# Patient Record
Sex: Female | Born: 1985 | Race: White | Hispanic: No | Marital: Married | Smoking: Never smoker
Health system: Southern US, Community
[De-identification: ages and names within clinical notes are randomized; demographics above are authoritative.]

## PROBLEM LIST (undated history)

## (undated) DIAGNOSIS — E282 Polycystic ovarian syndrome: Secondary | ICD-10-CM

## (undated) DIAGNOSIS — N946 Dysmenorrhea, unspecified: Secondary | ICD-10-CM

## (undated) DIAGNOSIS — K219 Gastro-esophageal reflux disease without esophagitis: Secondary | ICD-10-CM

## (undated) HISTORY — DX: Dysmenorrhea, unspecified: N94.6

## (undated) HISTORY — DX: Polycystic ovarian syndrome: E28.2

## (undated) HISTORY — PX: FRACTURE SURGERY: SHX138

## (undated) HISTORY — DX: Gastro-esophageal reflux disease without esophagitis: K21.9

---
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
## 2006-09-04 ENCOUNTER — Emergency Department: Payer: Self-pay | Admitting: Emergency Medicine

## 2006-09-12 ENCOUNTER — Ambulatory Visit: Payer: Self-pay | Admitting: Specialist

## 2006-12-23 HISTORY — PX: FOOT SURGERY: SHX648

## 2007-07-02 ENCOUNTER — Ambulatory Visit: Payer: Self-pay | Admitting: Specialist

## 2007-07-03 ENCOUNTER — Ambulatory Visit: Payer: Self-pay | Admitting: Specialist
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
## 2012-03-30 ENCOUNTER — Ambulatory Visit (INDEPENDENT_AMBULATORY_CARE_PROVIDER_SITE_OTHER): Payer: PRIVATE HEALTH INSURANCE | Admitting: Internal Medicine

## 2012-03-30 ENCOUNTER — Encounter: Payer: Self-pay | Admitting: Internal Medicine

## 2012-03-30 ENCOUNTER — Other Ambulatory Visit (HOSPITAL_COMMUNITY)
Admission: RE | Admit: 2012-03-30 | Discharge: 2012-03-30 | Disposition: A | Discharge status: Home / Self Care | Payer: PRIVATE HEALTH INSURANCE | Source: Ambulatory Visit | Attending: Internal Medicine | Admitting: Internal Medicine

## 2012-03-30 VITALS — BP 126/84 | HR 72 | Temp 98.4°F | Resp 16 | Ht 65.0 in | Wt 135.8 lb

## 2012-03-30 DIAGNOSIS — Z309 Encounter for contraceptive management, unspecified: Secondary | ICD-10-CM

## 2012-03-30 DIAGNOSIS — Z1159 Encounter for screening for other viral diseases: Secondary | ICD-10-CM | POA: Insufficient documentation

## 2012-03-30 DIAGNOSIS — Z0000 Encounter for general adult medical examination without abnormal findings: Secondary | ICD-10-CM

## 2012-03-30 DIAGNOSIS — Z01419 Encounter for gynecological examination (general) (routine) without abnormal findings: Secondary | ICD-10-CM | POA: Insufficient documentation

## 2012-03-30 MED ORDER — NORETHIN ACE-ETH ESTRAD-FE 1-20 MG-MCG(24) PO TABS: 1.0000 | ORAL_TABLET | Freq: Every day | ORAL | Status: DC

## 2012-03-30 NOTE — Assessment & Plan Note (Signed)
PAP smear was done today and UPT was negative

## 2012-03-30 NOTE — Progress Notes (Signed)
Patient ID: Alyssa Montes, female   DOB: October 31, 1986, 26 y.o.   MRN: [id number]   Annual Exam-Premenopausal Patient Active Problem List  Diagnoses  . Annual physical exam  . Contraceptive management    Subjective:  CC:   Chief Complaint  Patient presents with  . Gynecologic Exam    HPI:   Alyssa Montes a 26 y.o. female who presents for her annual exam.  It has been Has been 4 years since last PAP smear.  No interim pregnancies.  Regular menstrual periods.  Monogamous relationship.  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  Enjoys  her job.  NO health issues,  Sleeping well,  No high stress,    Takes MVI,  Uses condoms for protection.  No smoking history .  Negative domestic violence screen.    No past medical history on file.  No past surgical history on file.       The following portions of the patient's history were reviewed and updated as appropriate: Allergies, current medications, and problem list.    Review of Systems:   12 Pt  review of systems was negative except those addressed in the HPI,     History   Social History  . Marital Status: Single    Spouse Name: N/A    Number of Children: N/A  . Years of Education: N/A   Occupational History  . Works for [employer], doing Chief Financial Officer    Social History Main Topics  . Smoking status: Never Smoker   . Smokeless tobacco: Never Used  . Alcohol Use: Yes  . Drug Use: No  . Sexually Active: Yes   Other Topics Concern  . Not on file   Social History Narrative  . No narrative on file    Objective:  BP 126/84  Pulse 72  Temp(Src) 98.4 F (36.9 C) (Oral)  Resp 16  Ht 5\' 5"  (1.651 m)  Wt 135 lb 12 oz (61.576 kg)  BMI 22.59 kg/m2  SpO2 99%  LMP 03/07/2012  General appearance: alert, cooperative and appears stated age Ears: normal TM's and external ear canals both ears Throat: lips, mucosa, and tongue normal; teeth and gums normal Neck: no adenopathy, no carotid  bruit, supple, symmetrical, trachea midline and thyroid not enlarged, symmetric, no tenderness/mass/nodules Back: symmetric, no curvature. ROM normal. No CVA tenderness. Lungs: clear to auscultation bilaterally Heart: regular rate and rhythm, S1, S2 normal, no murmur, click, rub or gallop Abdomen: soft, non-tender; bowel sounds normal; no masses,  no organomegaly GYN: normal external genitalia, normal internal exam except some brownish dc noted, sent for PAP and culture Pulses: 2+ and symmetric Skin: Skin color, texture, turgor normal. No rashes or lesions Lymph nodes: Cervical, supraclavicular, and axillary nodes normal.  Assessment and Plan:  Annual physical exam PAP smear was done today and UPT was negative  Contraceptive management UPT was negative.  Education given regarding options for contraception, including oral contraceptives..  Rx for Loestrin given.  Will return for CMEt one month post initiation.     Updated Medication List Outpatient Encounter Prescriptions as of 03/30/2012  Medication Sig Dispense Refill  . fish oil-omega-3 fatty acids 1000 MG capsule Take 2 g by mouth daily.      . Multiple Vitamin (MULTIVITAMIN) tablet Take 1 tablet by mouth daily.      . Norethindrone Acetate-Ethinyl Estrad-FE (LOESTRIN 24 FE) 1-20 MG-MCG(24) tablet Take 1 tablet by mouth daily.  1 Package  11  Orders Placed This Encounter  Procedures  . POCT urine pregnancy    No Follow-up on file.    Marland Kitchen

## 2012-03-30 NOTE — Assessment & Plan Note (Signed)
UPT was negative.  Education given regarding options for contraception, including oral contraceptives..  Rx for Loestrin given.  Will return for CMEt one month post initiation.

## 2012-03-30 NOTE — Progress Notes (Signed)
Addended by: Jobie Quaker on: 03/30/2012 02:53 PM   Modules accepted: Orders

## 2012-03-30 NOTE — Patient Instructions (Signed)
Contraception Choices Contraception (birth control) is the use of any methods or devices to prevent pregnancy. Below are some methods to help avoid pregnancy. HORMONAL METHODS   Contraceptive implant. This is a thin, plastic tube containing progesterone hormone. It does not contain estrogen hormone. Your caregiver inserts the tube in the inner part of the upper arm. The tube can remain in place for up to 3 years. After 3 years, the implant must be removed. The implant prevents the ovaries from releasing an egg (ovulation), thickens the cervical mucus which prevents sperm from entering the uterus, and thins the lining of the inside of the uterus.   Progesterone-only injections. These injections are given every 3 months by your caregiver to prevent pregnancy. This synthetic progesterone hormone stops the ovaries from releasing eggs. It also thickens cervical mucus and changes the uterine lining. This makes it harder for sperm to survive in the uterus.   Birth control pills. These pills contain estrogen and progesterone hormone. They work by stopping the egg from forming in the ovary (ovulation). Birth control pills are prescribed by a caregiver.Birth control pills can also be used to treat heavy periods.   Minipill. This type of birth control pill contains only the progesterone hormone. They are taken every day of each month and must be prescribed by your caregiver.   Birth control patch. The patch contains hormones similar to those in birth control pills. It must be changed once a week and is prescribed by a caregiver.   Vaginal ring. The ring contains hormones similar to those in birth control pills. It is left in the vagina for 3 weeks, removed for 1 week, and then a new one is put back in place. The patient must be comfortable inserting and removing the ring from the vagina.A caregiver's prescription is necessary.   Emergency contraception. Emergency contraceptives prevent pregnancy after  unprotected sexual intercourse. This pill can be taken right after sex or up to 5 days after unprotected sex. It is most effective the sooner you take the pills after having sexual intercourse. Emergency contraceptive pills are available without a prescription. Check with your pharmacist. Do not use emergency contraception as your only form of birth control.  BARRIER METHODS   Female condom. This is a thin sheath (latex or rubber) that is worn over the penis during sexual intercourse. It can be used with spermicide to increase effectiveness.   Female condom. This is a soft, loose-fitting sheath that is put into the vagina before sexual intercourse.   Diaphragm. This is a soft, latex, dome-shaped barrier that must be fitted by a caregiver. It is inserted into the vagina, along with a spermicidal jelly. It is inserted before intercourse. The diaphragm should be left in the vagina for 6 to 8 hours after intercourse.   Cervical cap. This is a round, soft, latex or plastic cup that fits over the cervix and must be fitted by a caregiver. The cap can be left in place for up to 48 hours after intercourse.   Sponge. This is a soft, circular piece of polyurethane foam. The sponge has spermicide in it. It is inserted into the vagina after wetting it and before sexual intercourse.   Spermicides. These are chemicals that kill or block sperm from entering the cervix and uterus. They come in the form of creams, jellies, suppositories, foam, or tablets. They do not require a prescription. They are inserted into the vagina with an applicator before having sexual intercourse. The process must be   repeated every time you have sexual intercourse.  INTRAUTERINE CONTRACEPTION  Intrauterine device (IUD). This is a T-shaped device that is put in a woman's uterus during a menstrual period to prevent pregnancy. There are 2 types:   Copper IUD. This type of IUD is wrapped in copper wire and is placed inside the uterus. Copper  makes the uterus and fallopian tubes produce a fluid that kills sperm. It can stay in place for 10 years.   Hormone IUD. This type of IUD contains the hormone progestin (synthetic progesterone). The hormone thickens the cervical mucus and prevents sperm from entering the uterus, and it also thins the uterine lining to prevent implantation of a fertilized egg. The hormone can weaken or kill the sperm that get into the uterus. It can stay in place for 5 years.  PERMANENT METHODS OF CONTRACEPTION  Female tubal ligation. This is when the woman's fallopian tubes are surgically sealed, tied, or blocked to prevent the egg from traveling to the uterus.   Female sterilization. This is when the female has the tubes that carry sperm tied off (vasectomy).This blocks sperm from entering the vagina during sexual intercourse. After the procedure, the man can still ejaculate fluid (semen).  NATURAL PLANNING METHODS  Natural family planning. This is not having sexual intercourse or using a barrier method (condom, diaphragm, cervical cap) on days the woman could become pregnant.   Calendar method. This is keeping track of the length of each menstrual cycle and identifying when you are fertile.   Ovulation method. This is avoiding sexual intercourse during ovulation.   Symptothermal method. This is avoiding sexual intercourse during ovulation, using a thermometer and ovulation symptoms.   Post-ovulation method. This is timing sexual intercourse after you have ovulated.  Regardless of which type or method of contraception you choose, it is important that you use condoms to protect against the transmission of sexually transmitted diseases (STDs). Talk with your caregiver about which form of contraception is most appropriate for you. Document Released: 12/09/2005 Document Revised: 11/28/2011 Document Reviewed: 04/17/2011 [employer] Patient Information 2012 [employer]. 

## 2013-10-01 ENCOUNTER — Ambulatory Visit (INDEPENDENT_AMBULATORY_CARE_PROVIDER_SITE_OTHER): Payer: BC Managed Care – PPO | Admitting: Internal Medicine

## 2013-10-01 ENCOUNTER — Encounter: Payer: Self-pay | Admitting: Internal Medicine

## 2013-10-01 VITALS — BP 120/82 | HR 77 | Temp 98.3°F | Resp 14 | Ht 65.75 in | Wt 149.5 lb

## 2013-10-01 DIAGNOSIS — Z0000 Encounter for general adult medical examination without abnormal findings: Secondary | ICD-10-CM

## 2013-10-03 ENCOUNTER — Encounter: Payer: Self-pay | Admitting: Internal Medicine

## 2013-10-03 NOTE — Progress Notes (Signed)
Patient ID: Alyssa Montes, female   DOB: 12/10/1986, 27 y.o.   MRN: [id number]  Subjective:     Alyssa Montes is a 27 y.o. female and is here for a comprehensive physical exam. The patient reports no problems.  She did not tolerate the previously prescribed OCPS due to mood changes and discontinued it after a few months.  She has been using barrier protection with her boyfriend  And has had no slips. Her periods are regular.  She is wearing seatbelts, exercising regularly ,  And does not drink alcohol.    History   Social History  . Marital Status: Single    Spouse Name: N/A    Number of Children: N/A  . Years of Education: N/A   Occupational History  . Not on file.   Social History Main Topics  . Smoking status: Never Smoker   . Smokeless tobacco: Never Used  . Alcohol Use: Yes  . Drug Use: No  . Sexual Activity: Yes   Other Topics Concern  . Not on file   Social History Narrative  . No narrative on file   Health Maintenance  Topic Date Due  . Tetanus/tdap  09/22/2006  . Influenza Vaccine  07/23/2013  . Pap Smear  03/31/2015    The following portions of the patient's history were reviewed and updated as appropriate: allergies, current medications, past family history, past medical history, past social history, past surgical history and problem list.  Review of Systems A comprehensive review of systems was negative.   Objective:   BP 120/82  Pulse 77  Temp(Src) 98.3 F (36.8 C) (Oral)  Resp 14  Ht 5' 5.75" (1.67 m)  Wt 149 lb 8 oz (67.813 kg)  BMI 24.32 kg/m2  SpO2 99%  LMP 09/08/2013  General Appearance:    Alert, cooperative, no distress, appears stated age  Head:    Normocephalic, without obvious abnormality, atraumatic  Eyes:    PERRL, conjunctiva/corneas clear, EOM's intact, fundi    benign, both eyes  Ears:    Normal TM's and external ear canals, both ears  Nose:   Nares normal, septum midline, mucosa normal, no drainage    or sinus tenderness   Throat:   Lips, mucosa, and tongue normal; teeth and gums normal  Neck:   Supple, symmetrical, trachea midline, no adenopathy;    thyroid:  no enlargement/tenderness/nodules; no carotid   bruit or JVD  Back:     Symmetric, no curvature, ROM normal, no CVA tenderness  Lungs:     Clear to auscultation bilaterally, respirations unlabored  Chest Wall:    No tenderness or deformity   Heart:    Regular rate and rhythm, S1 and S2 normal, no murmur, rub   or gallop  Abdomen:     Soft, non-tender, bowel sounds active all four quadrants,    no masses, no organomegaly  Extremities:   Extremities normal, atraumatic, no cyanosis or edema  Pulses:   2+ and symmetric all extremities  Skin:   Skin color, texture, turgor normal, no rashes or lesions  Lymph nodes:   Cervical, supraclavicular, and axillary nodes normal  Neurologic:   CNII-XII intact, normal strength, sensation and reflexes    throughout    Assessment:    Annual physical exam Annual comprehensive exam was done including breast, excluding pelvic and PAP smear.   Contraceptive management She did not tolerate OCPS due to changes in mood.  Disucssed alternatives.  She uses barrier protection regularly for avoidance of STDs,  and wants to consider IUD and other hormonal alternatives.  Information given.    Updated Medication List Outpatient Encounter Prescriptions as of 10/01/2013  Medication Sig Dispense Refill  . fish oil-omega-3 fatty acids 1000 MG capsule Take 2 g by mouth daily.      . Multiple Vitamin (MULTIVITAMIN) tablet Take 1 tablet by mouth daily.      . [DISCONTINUED] Norethindrone Acetate-Ethinyl Estrad-FE (LOESTRIN 24 FE) 1-20 MG-MCG(24) tablet Take 1 tablet by mouth daily.  1 Package  11   No facility-administered encounter medications on file as of 10/01/2013.

## 2013-10-03 NOTE — Assessment & Plan Note (Signed)
She did not tolerate OCPS due to changes in mood.  Disucssed alternatives.  She uses barrier protection regularly for avoidance of STDs, and wants to consider IUD and other hormonal alternatives.  Information given.

## 2013-10-03 NOTE — Assessment & Plan Note (Signed)
Annual comprehensive exam was done including breast, excluding pelvic and PAP smear.   

## 2013-10-29 LAB — HEPATIC FUNCTION PANEL
AST: 24 U/L (ref 13–35)
Bilirubin, Total: 0.6 mg/dL

## 2013-10-29 LAB — BASIC METABOLIC PANEL
BUN: 13 mg/dL (ref 4–21)
Creatinine: 0.8 mg/dL (ref 0.5–1.1)
Glucose: 92 mg/dL
Potassium: 4.3 mmol/L (ref 3.4–5.3)
Sodium: 139 mmol/L (ref 137–147)

## 2013-10-29 LAB — LIPID PANEL
LDL Cholesterol: 132 mg/dL
Triglycerides: 91 mg/dL (ref 40–160)

## 2013-10-29 LAB — CBC AND DIFFERENTIAL: HCT: 44 % (ref 36–46)

## 2013-10-29 LAB — TSH: TSH: 3.1 u[IU]/mL (ref <=5.90)

## 2013-11-08 ENCOUNTER — Encounter: Payer: Self-pay | Admitting: *Deleted

## 2013-11-08 ENCOUNTER — Telehealth: Payer: Self-pay | Admitting: Internal Medicine

## 2013-11-08 NOTE — Telephone Encounter (Signed)
Letter mailed

## 2013-11-08 NOTE — Telephone Encounter (Signed)
All labs normal,. Including thyroid function and cholesterol   

## 2018-01-06 ENCOUNTER — Telehealth: Payer: Self-pay

## 2018-01-06 NOTE — Telephone Encounter (Signed)
Copied from CRM [id number]. Topic: General - Other >> Jan 06, 2018 10:22 AM [name], Alyssa wrote: Reason for CRM: Patient is requesting a copy of her immunizations, [place] and would like to pick them up today is possible. Patient can be reached @ [phone] for questions.

## 2018-01-06 NOTE — Telephone Encounter (Signed)
Attempted to call pt. No answer, no voicemail. If the pt calls back we can let her know that we have printed off her immunization record and placed it up front to be picked up.

## 2018-01-07 NOTE — Telephone Encounter (Signed)
Pt aware records are ready and is on the way to pick up.

## 2018-05-05 ENCOUNTER — Encounter: Payer: Self-pay | Admitting: Certified Nurse Midwife

## 2018-05-05 ENCOUNTER — Ambulatory Visit (INDEPENDENT_AMBULATORY_CARE_PROVIDER_SITE_OTHER): Payer: BLUE CROSS/BLUE SHIELD | Admitting: Certified Nurse Midwife

## 2018-05-05 ENCOUNTER — Other Ambulatory Visit: Payer: Self-pay

## 2018-05-05 ENCOUNTER — Other Ambulatory Visit (HOSPITAL_COMMUNITY)
Admission: RE | Admit: 2018-05-05 | Discharge: 2018-05-05 | Disposition: A | Discharge status: Home / Self Care | Payer: BLUE CROSS/BLUE SHIELD | Source: Ambulatory Visit | Attending: Certified Nurse Midwife | Admitting: Certified Nurse Midwife

## 2018-05-05 VITALS — BP 106/78 | HR 74 | Resp 12 | Ht 65.75 in | Wt 153.0 lb

## 2018-05-05 DIAGNOSIS — Z124 Encounter for screening for malignant neoplasm of cervix: Secondary | ICD-10-CM | POA: Diagnosis not present

## 2018-05-05 DIAGNOSIS — Z01419 Encounter for gynecological examination (general) (routine) without abnormal findings: Secondary | ICD-10-CM | POA: Insufficient documentation

## 2018-05-05 NOTE — Patient Instructions (Signed)
EXERCISE AND DIET:  We recommended that you start or continue a regular exercise program for good health. Regular exercise means any activity that makes your heart beat faster and makes you sweat.  We recommend exercising at least 30 minutes per day at least 3 days a week, preferably 4 or 5.  We also recommend a diet low in fat and sugar.  Inactivity, poor dietary choices and obesity can cause diabetes, heart attack, stroke, and kidney damage, among others.    ALCOHOL AND SMOKING:  Women should limit their alcohol intake to no more than 7 drinks/beers/glasses of wine (combined, not each!) per week. Moderation of alcohol intake to this level decreases your risk of breast cancer and liver damage. And of course, no recreational drugs are part of a healthy lifestyle.  And absolutely no smoking or even second hand smoke. Most people know smoking can cause heart and lung diseases, but did you know it also contributes to weakening of your bones? Aging of your skin?  Yellowing of your teeth and nails?  CALCIUM AND VITAMIN D:  Adequate intake of calcium and Vitamin D are recommended.  The recommendations for exact amounts of these supplements seem to change often, but generally speaking 600 mg of calcium (either carbonate or citrate) and 800 units of Vitamin D per day seems prudent. Certain women may benefit from higher intake of Vitamin D.  If you are among these women, your doctor will have told you during your visit.    PAP SMEARS:  Pap smears, to check for cervical cancer or precancers,  have traditionally been done yearly, although recent scientific advances have shown that most women can have pap smears less often.  However, every woman still should have a physical exam from her gynecologist every year. It will include a breast check, inspection of the vulva and vagina to check for abnormal growths or skin changes, a visual exam of the cervix, and then an exam to evaluate the size and shape of the uterus and  ovaries.  And after 31 years of age, a rectal exam is indicated to check for rectal cancers. We will also provide age appropriate advice regarding health maintenance, like when you should have certain vaccines, screening for sexually transmitted diseases, bone density testing, colonoscopy, mammograms, etc.   MAMMOGRAMS:  All women over 40 years old should have a yearly mammogram. Many facilities now offer a "3D" mammogram, which may cost around $50 extra out of pocket. If possible,  we recommend you accept the option to have the 3D mammogram performed.  It both reduces the number of women who will be called back for extra views which then turn out to be normal, and it is better than the routine mammogram at detecting truly abnormal areas.    COLONOSCOPY:  Colonoscopy to screen for colon cancer is recommended for all women at age 50.  We know, you hate the idea of the prep.  We agree, BUT, having colon cancer and not knowing it is worse!!  Colon cancer so often starts as a polyp that can be seen and removed at colonscopy, which can quite literally save your life!  And if your first colonoscopy is normal and you have no family history of colon cancer, most women don't have to have it again for 10 years.  Once every ten years, you can do something that may end up saving your life, right?  We will be happy to help you get it scheduled when you are ready.    Be sure to check your insurance coverage so you understand how much it will cost.  It may be covered as a preventative service at no cost, but you should check your particular policy.      Prenatal Care WHAT IS PRENATAL CARE? Prenatal care is the process of caring for a pregnant woman before she gives birth. Prenatal care makes sure that she and her baby remain as healthy as possible throughout pregnancy. Prenatal care may be provided by a midwife, family practice health care provider, or a childbirth and pregnancy specialist (obstetrician). Prenatal care may  include physical examinations, testing, treatments, and education on nutrition, lifestyle, and social support services. WHY IS PRENATAL CARE SO IMPORTANT? Early and consistent prenatal care increases the chance that you and your baby will remain healthy throughout your pregnancy. This type of care also decreases a baby's risk of being born too early (prematurely), or being born smaller than expected (small for gestational age). Any underlying medical conditions you may have that could pose a risk during your pregnancy are discussed during prenatal care visits. You will also be monitored regularly for any new conditions that may arise during your pregnancy so they can be treated quickly and effectively. WHAT HAPPENS DURING PRENATAL CARE VISITS? Prenatal care visits may include the following: Discussion Tell your health care provider about any new signs or symptoms you have experienced since your last visit. These might include:  Nausea or vomiting.  Increased or decreased level of energy.  Difficulty sleeping.  Back or leg pain.  Weight changes.  Frequent urination.  Shortness of breath with physical activity.  Changes in your skin, such as the development of a rash or itchiness.  Vaginal discharge or bleeding.  Feelings of excitement or nervousness.  Changes in your baby's movements.  You may want to write down any questions or topics you want to discuss with your health care provider and bring them with you to your appointment. Examination During your first prenatal care visit, you will likely have a complete physical exam. Your health care provider will often examine your vagina, cervix, and the position of your uterus, as well as check your heart, lungs, and other body systems. As your pregnancy progresses, your health care provider will measure the size of your uterus and your baby's position inside your uterus. He or she may also examine you for early signs of labor. Your  prenatal visits may also include checking your blood pressure and, after about 10-12 weeks of pregnancy, listening to your baby's heartbeat. Testing Regular testing often includes:  Urinalysis. This checks your urine for glucose, protein, or signs of infection.  Blood count. This checks the levels of white and red blood cells in your body.  Tests for sexually transmitted infections (STIs). Testing for STIs at the beginning of pregnancy is routinely done and is required in many states.  Antibody testing. You will be checked to see if you are immune to certain illnesses, such as rubella, that can affect a developing fetus.  Glucose screen. Around 24-28 weeks of pregnancy, your blood glucose level will be checked for signs of gestational diabetes. Follow-up tests may be recommended.  Group B strep. This is a bacteria that is commonly found inside a woman's vagina. This test will inform your health care provider if you need an antibiotic to reduce the amount of this bacteria in your body prior to labor and childbirth.  Ultrasound. Many pregnant women undergo an ultrasound screening around 18-20 weeks of pregnancy   to evaluate the health of the fetus and check for any developmental abnormalities.  HIV (human immunodeficiency virus) testing. Early in your pregnancy, you will be screened for HIV. If you are at high risk for HIV, this test may be repeated during your third trimester of pregnancy.  You may be offered other testing based on your age, personal or family medical history, or other factors. HOW OFTEN SHOULD I PLAN TO SEE MY HEALTH CARE PROVIDER FOR PRENATAL CARE? Your prenatal care check-up schedule depends on any medical conditions you have before, or develop during, your pregnancy. If you do not have any underlying medical conditions, you will likely be seen for checkups:  Monthly, during the first 6 months of pregnancy.  Twice a month during months 7 and 8 of pregnancy.  Weekly  starting in the 9th month of pregnancy and until delivery.  If you develop signs of early labor or other concerning signs or symptoms, you may need to see your health care provider more often. Ask your health care provider what prenatal care schedule is best for you. WHAT CAN I DO TO KEEP MYSELF AND MY BABY AS HEALTHY AS POSSIBLE DURING MY PREGNANCY?  Take a prenatal vitamin containing 400 micrograms (0.4 mg) of folic acid every day. Your health care provider may also ask you to take additional vitamins such as iodine, vitamin D, iron, copper, and zinc.  Take 1500-2000 mg of calcium daily starting at your 20th week of pregnancy until you deliver your baby.  Make sure you are up to date on your vaccinations. Unless directed otherwise by your health care provider: ? You should receive a tetanus, diphtheria, and pertussis (Tdap) vaccination between the 27th and 36th week of your pregnancy, regardless of when your last Tdap immunization occurred. This helps protect your baby from whooping cough (pertussis) after he or she is born. ? You should receive an annual inactivated influenza vaccine (IIV) to help protect you and your baby from influenza. This can be done at any point during your pregnancy.  Eat a well-rounded diet that includes: ? Fresh fruits and vegetables. ? Lean proteins. ? Calcium-rich foods such as milk, yogurt, hard cheeses, and dark, leafy greens. ? Whole grain breads.  Do noteat seafood high in mercury, including: ? Swordfish. ? Tilefish. ? Shark. ? King mackerel. ? More than 6 oz tuna per week.  Do not eat: ? Raw or undercooked meats or eggs. ? Unpasteurized foods, such as soft cheeses (brie, blue, or feta), juices, and milks. ? Lunch meats. ? Hot dogs that have not been heated until they are steaming.  Drink enough water to keep your urine clear or pale yellow. For many women, this may be 10 or more 8 oz glasses of water each day. Keeping yourself hydrated helps  deliver nutrients to your baby and may prevent the start of pre-term uterine contractions.  Do not use any tobacco products including cigarettes, chewing tobacco, or electronic cigarettes. If you need help quitting, ask your health care provider.  Do not drink beverages containing alcohol. No safe level of alcohol consumption during pregnancy has been determined.  Do not use any illegal drugs. These can harm your developing baby or cause a miscarriage.  Ask your health care provider or pharmacist before taking any prescription or over-the-counter medicines, herbs, or supplements.  Limit your caffeine intake to no more than 200 mg per day.  Exercise. Unless told otherwise by your health care provider, try to get 30 minutes of moderate   exercise most days of the week. Do not  do high-impact activities, contact sports, or activities with a high risk of falling, such as horseback riding or downhill skiing.  Get plenty of rest.  Avoid anything that raises your body temperature, such as hot tubs and saunas.  If you own a cat, do not empty its litter box. Bacteria contained in cat feces can cause an infection called toxoplasmosis. This can result in serious harm to the fetus.  Stay away from chemicals such as insecticides, lead, mercury, and cleaning or paint products that contain solvents.  Do not have any X-rays taken unless medically necessary.  Take a childbirth and breastfeeding preparation class. Ask your health care provider if you need a referral or recommendation.  This information is not intended to replace advice given to you by your health care provider. Make sure you discuss any questions you have with your health care provider. Document Released: 12/12/2003 Document Revised: 05/13/2016 Document Reviewed: 02/23/2014 Elsevier Interactive Patient Education  2017 [employer]   

## 2018-05-05 NOTE — Progress Notes (Signed)
32 y.o. G0P0000 married  Caucasian Fe here to establish gyn care and for annual exam. Last aex 2016. Periods  are every other month,duration 4-5 days, heavy days 1-2 and light, cramping relieved with OTC Advil. Patient travels a lot and feels this contributes to the every other month. Dr. Darrick Huntsman as PCP, prn, has FNP at work for prn visits and  labs also. All normal per patient. Planning on trying for pregnancy soon. No other health issues today.  Patient's last menstrual period was 05/05/2018.          Sexually active: Yes.    The current method of family planning is none, previous condom use    Exercising: Yes.    walking, tennis  Smoker:  no  Health Maintenance: Pap:  03/30/12 negative, HR HPV negative, 03/2015 at [facility]- normal per patient History of Abnormal Pap: no MMG:  never Self Breast exams: yes Colonoscopy:  never BMD:   never TDaP:  12/2017 at work  Shingles: no Pneumonia: no Hep C and HIV: unsure  Labs: done with work    reports that she has never smoked. She has never used smokeless tobacco. She reports that she drinks alcohol. She reports that she does not use drugs.  Past Medical History:  Diagnosis Date  . Dysmenorrhea     Past Surgical History:  Procedure Laterality Date  . 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 FOOT SURGERY Right 2008    Current Outpatient Medications  Medication Sig Dispense Refill  . Cholecalciferol (VITAMIN D PO) Take 1,000 Int'l Units by mouth daily.    . Prenatal Vit-Fe Fumarate-FA (PRENATAL PO) Take 1 tablet by mouth daily.     No current facility-administered medications for this visit.     Family History  Problem Relation Age of Onset  . Hyperlipidemia Father   . Stroke Maternal Grandfather   . Heart disease Maternal Grandfather   . Heart disease Paternal Grandfather   . Stroke Paternal Grandfather     ROS:  Pertinent items are noted in HPI.  Otherwise, a comprehensive ROS was negative.  Exam:   BP  106/78 (BP Location: Right Arm, Patient Position: Sitting, Cuff Size: Normal)   Pulse 74   Resp 12   Ht 5' 5.75" (1.67 m)   Wt 153 lb (69.4 kg)   LMP 05/05/2018   BMI 24.88 kg/m  Height: 5' 5.75" (167 cm) Ht Readings from Last 3 Encounters:  05/05/18 5' 5.75" (1.67 m)  10/01/13 5' 5.75" (1.67 m)  03/30/12  (1.651 m)    General appearance: alert, cooperative and appears stated age Head: Normocephalic, without obvious abnormality, atraumatic Neck: no adenopathy, supple, symmetrical, trachea midline and thyroid normal to inspection and palpation Lungs: clear to auscultation bilaterally Breasts: normal appearance, no masses or tenderness, No nipple retraction or dimpling, No nipple discharge or bleeding, No axillary or supraclavicular adenopathy Heart: regular rate and rhythm Abdomen: soft, non-tender; no masses,  no organomegaly Extremities: extremities normal, atraumatic, no cyanosis or edema Skin: Skin color, texture, turgor normal. No rashes or lesions Lymph nodes: Cervical, supraclavicular, and axillary nodes normal. No abnormal inguinal nodes palpated Neurologic: Grossly normal   Pelvic: External genitalia:  no lesions              Urethra:  normal appearing urethra with no masses, tenderness or lesions              Bartholin's and Skene's: normal  Vagina: normal appearing vagina with normal color and discharge, no lesions              Cervix: no cervical motion tenderness, no lesions and nulliparous appearance              Pap taken: Yes.   Bimanual Exam:  Uterus:  normal size, contour, position, consistency, mobility, non-tender and anteverted              Adnexa: normal adnexa and no mass, fullness, tenderness               Rectovaginal: Confirms               Anus:  normal sphincter tone, no lesions  Chaperone present: yes  A:  Well Woman with normal exam  Contraception none pregnancy would be welcome  Regular periods at 45-55 days, no  menorrhagia  Rubella immune status unknown   P:   Reviewed health and wellness pertinent to exam and normal pelvic exam noted  Discussed BBT graph use with determining fertile(ovulatory) time in cycle. Given graph with instructions. Questions addressed. Discussed importance of Prenatal vitamins daily and working on good health habits and diet. Discussed average woman will conceive in the first year of trying. If not pregnancy by 9 months, would recommend sperm count of spouse, prior to other testing. Patient given handout on pregnancy planning. Questions addressed.  Patient will have Rubella titer done at work and send lab copies here.  Pap smear: yes   counseled on breast self exam, mammography screening, feminine hygiene, adequate intake of calcium and vitamin D, diet and exercise  return annually or prn  An After Visit Summary was printed and given to the patient.

## 2018-05-06 LAB — CYTOLOGY - PAP
Diagnosis: NEGATIVE
HPV: NOT DETECTED

## 2018-05-15 ENCOUNTER — Telehealth: Payer: Self-pay | Admitting: Certified Nurse Midwife

## 2018-05-15 ENCOUNTER — Encounter: Payer: Self-pay | Admitting: Certified Nurse Midwife

## 2018-05-15 NOTE — Telephone Encounter (Signed)
Patient sent the following correspondence through MyChart. Routing to triage to assist patient with request.  ----- Message from Mychart, Generic sent at 05/15/2018 8:51 AM EDT -----    Hi,    Thank you for seeing me last week. I had a Rubella tighter performed through work as you recommended and I do have immunity. Attached is the report for reference.    Thank you,   Alyssa Montes

## 2018-05-15 NOTE — Telephone Encounter (Signed)
Routing to Deborah Leonard CNM as FYI. Encounter closed. 

## 2018-06-01 ENCOUNTER — Ambulatory Visit: Payer: Self-pay | Admitting: Internal Medicine

## 2019-05-07 ENCOUNTER — Ambulatory Visit: Payer: BLUE CROSS/BLUE SHIELD | Admitting: Certified Nurse Midwife

## 2019-06-01 DIAGNOSIS — Z319 Encounter for procreative management, unspecified: Secondary | ICD-10-CM | POA: Diagnosis not present

## 2019-06-01 DIAGNOSIS — N912 Amenorrhea, unspecified: Secondary | ICD-10-CM | POA: Diagnosis not present

## 2019-06-01 DIAGNOSIS — E282 Polycystic ovarian syndrome: Secondary | ICD-10-CM | POA: Diagnosis not present

## 2019-06-01 DIAGNOSIS — Z3141 Encounter for fertility testing: Secondary | ICD-10-CM | POA: Diagnosis not present

## 2019-07-05 DIAGNOSIS — E288 Other ovarian dysfunction: Secondary | ICD-10-CM | POA: Diagnosis not present

## 2019-07-05 DIAGNOSIS — Z319 Encounter for procreative management, unspecified: Secondary | ICD-10-CM | POA: Diagnosis not present

## 2019-07-09 DIAGNOSIS — M7711 Lateral epicondylitis, right elbow: Secondary | ICD-10-CM | POA: Diagnosis not present

## 2019-07-13 DIAGNOSIS — Z319 Encounter for procreative management, unspecified: Secondary | ICD-10-CM | POA: Diagnosis not present

## 2019-07-26 DIAGNOSIS — Z319 Encounter for procreative management, unspecified: Secondary | ICD-10-CM | POA: Diagnosis not present

## 2019-07-26 DIAGNOSIS — E288 Other ovarian dysfunction: Secondary | ICD-10-CM | POA: Diagnosis not present

## 2019-08-04 DIAGNOSIS — Z3169 Encounter for other general counseling and advice on procreation: Secondary | ICD-10-CM | POA: Diagnosis not present

## 2019-08-04 DIAGNOSIS — Z3141 Encounter for fertility testing: Secondary | ICD-10-CM | POA: Diagnosis not present

## 2019-08-23 DIAGNOSIS — Z319 Encounter for procreative management, unspecified: Secondary | ICD-10-CM | POA: Diagnosis not present

## 2019-08-23 DIAGNOSIS — E288 Other ovarian dysfunction: Secondary | ICD-10-CM | POA: Diagnosis not present

## 2019-10-20 DIAGNOSIS — E288 Other ovarian dysfunction: Secondary | ICD-10-CM | POA: Diagnosis not present

## 2019-10-20 DIAGNOSIS — Z3141 Encounter for fertility testing: Secondary | ICD-10-CM | POA: Diagnosis not present

## 2019-10-23 ENCOUNTER — Encounter (HOSPITAL_COMMUNITY): Payer: Self-pay | Admitting: Emergency Medicine

## 2019-10-23 ENCOUNTER — Emergency Department (HOSPITAL_COMMUNITY)
Admission: EM | Admit: 2019-10-23 | Discharge: 2019-10-23 | Disposition: A | Discharge status: Home / Self Care | Payer: BC Managed Care – PPO | Attending: Emergency Medicine | Admitting: Emergency Medicine

## 2019-10-23 ENCOUNTER — Other Ambulatory Visit: Payer: Self-pay

## 2019-10-23 ENCOUNTER — Emergency Department (HOSPITAL_COMMUNITY): Payer: BC Managed Care – PPO

## 2019-10-23 DIAGNOSIS — Y999 Unspecified external cause status: Secondary | ICD-10-CM | POA: Diagnosis not present

## 2019-10-23 DIAGNOSIS — Y939 Activity, unspecified: Secondary | ICD-10-CM | POA: Insufficient documentation

## 2019-10-23 DIAGNOSIS — S02401A Maxillary fracture, unspecified, initial encounter for closed fracture: Secondary | ICD-10-CM

## 2019-10-23 DIAGNOSIS — Y92003 Bedroom of unspecified non-institutional (private) residence as the place of occurrence of the external cause: Secondary | ICD-10-CM | POA: Diagnosis not present

## 2019-10-23 DIAGNOSIS — S0240CA Maxillary fracture, right side, initial encounter for closed fracture: Secondary | ICD-10-CM | POA: Diagnosis not present

## 2019-10-23 DIAGNOSIS — W01190A Fall on same level from slipping, tripping and stumbling with subsequent striking against furniture, initial encounter: Secondary | ICD-10-CM | POA: Diagnosis not present

## 2019-10-23 DIAGNOSIS — S0990XA Unspecified injury of head, initial encounter: Secondary | ICD-10-CM | POA: Diagnosis not present

## 2019-10-23 MED ORDER — AMOXICILLIN-POT CLAVULANATE 875-125 MG PO TABS: 1.0000 | ORAL_TABLET | Freq: Two times a day (BID) | ORAL | 0 refills | Status: DC

## 2019-10-23 MED ORDER — ACETAMINOPHEN 500 MG PO TABS
1000.0000 mg | ORAL_TABLET | Freq: Once | ORAL | Status: AC
  Administered 2019-10-23: 1000 mg via ORAL
  Filled 2019-10-23: qty 2

## 2019-10-23 NOTE — ED Provider Notes (Signed)
[facility] EMERGENCY DEPARTMENT Provider Note   CSN: [id number] Arrival date & time: 10/23/19  0946     History   Chief Complaint Chief Complaint  Patient presents with   Fall   Facial Injury    HPI Alyssa Montes is a 34 y.o. female.     The history is provided by the patient and medical records. No language interpreter was used.  Fall  Facial Injury Associated symptoms: epistaxis    Alyssa Montes is a 33 y.o. female  with a PMH as listed below who presents to the Emergency Department complaining of right facial pain and swelling after fall just prior to arrival.  Patient states that she was trying to get out of bed this morning when she tripped and her cheek hit the corner of her wooden bed frame.  Husband at bedside states that she was disoriented for about 20 seconds, but has been baseline since then.  No numbness or weakness.  No visual changes or headache.  Has been applying ice with little improvement of the pain/swelling.  She did have a very mild right sided nosebleed which is now resolved.  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 Authorizing Provider  Prenatal Vit-Fe Fumarate-FA (PRENATAL PO) Take 1 tablet by mouth daily.   Yes [provider]  amoxicillin-clavulanate (AUGMENTIN) 875-125 MG tablet Take 1 tablet by mouth every 12 (twelve) hours. 10/23/19   Pr[place]inger, Chase Picket, PA-C    Family History Family History  Problem Relation Age of Onset   Hyperlipidemia Father    Stroke Maternal Grandfather    Heart disease Maternal Grandfather    Heart disease Paternal Grandfather    Stroke Paternal Grandfather     Social History Social History   Tobacco Use   Smoking status: Never Smoker   Smokeless tobacco: Never Used  Substance Use Topics   Alcohol use: Yes    Comment: 6    Drug use: No     Allergies   Patient has no known allergies.   Review of Systems Review of Systems  HENT: Positive for facial swelling and nosebleeds.   All other systems reviewed and are negative.    Physical Exam Updated Vital Signs BP (!) 115/92    Pulse 80    Temp 98.2 F (36.8 C) (Oral)    Resp 16    LMP 10/09/2019    SpO2 98%   Physical Exam Vitals signs and nursing note reviewed.  Constitutional:      General: She is not in acute distress.    Appearance: She is well-developed.  HENT:     Head: Normocephalic.  Comments: Tenderness and swelling to the right maxillary region and right side of the nose.  No septal hematoma. Neck:     Musculoskeletal: Neck supple.     Comments: No midline or paraspinal tenderness. Cardiovascular:     Rate and Rhythm: Normal rate and regular rhythm.     Heart sounds: Normal heart sounds. No murmur.  Pulmonary:     Effort: Pulmonary effort is normal. No respiratory distress.     Breath sounds: Normal breath sounds.  Abdominal:     General: There is no distension.     Palpations: Abdomen is soft.     Tenderness: There is no abdominal tenderness.  Skin:    General: Skin is warm and dry.  Neurological:     Mental Status: She is alert and oriented to person, place, and time.     Comments: Alert, oriented, thought content appropriate. Able to give a coherent history. Speech is clear and goal oriented, able to follow commands.  Cranial Nerves:  II:  Peripheral visual fields grossly normal, pupils equal,  round, reactive to light III, IV, VI: EOM intact bilaterally, ptosis not present V,VII: smile symmetric, eyes kept closed tightly against resistance, facial light touch sensation equal VIII: hearing grossly normal IX, X: symmetric soft palate movement, uvula elevates symmetrically  XI: bilateral shoulder shrug symmetric and strong XII: midline tongue extension 5/5 muscle strength in upper and lower extremities bilaterally including strong and equal grip strength and dorsiflexion/plantar flexion Sensory to light touch normal in all four extremities.  Normal gait and balance.      ED Treatments / Results  Labs (all labs ordered are listed, but only abnormal results are displayed) Labs Reviewed - No data to display  EKG None  Radiology Ct Maxillofacial Wo Contrast  Result Date: 10/23/2019 CLINICAL DATA:  Patient tripped and struck the right side of her face on her bed. Right-sided facial pain and swelling. EXAM: CT MAXILLOFACIAL WITHOUT CONTRAST TECHNIQUE: Multidetector CT imaging of the maxillofacial structures was performed. Multiplanar CT image reconstructions were also generated. COMPARISON:  None. FINDINGS: Osseous: Fractures of the anterior right maxillary sinus wall. Fractures are mildly comminuted, and mildly depressed posteriorly by 3 mm. No other fractures.  No bone lesions. Orbits: Negative. No traumatic or inflammatory finding. Sinuses: Dependent hemorrhage layers in the posterior right maxillary sinus. Remaining sinuses are clear. Clear mastoid air cells and middle ear cavities. Soft tissues: Small area of contusion with soft tissue air anterior and inferior to the fractured right anterior maxillary wall. Limited intracranial: No significant or unexpected finding. IMPRESSION: 1. Fractures of the anterior wall the right maxillary sinus, mildly comminuted with approximately 3 mm of posterior depression. There is associated hemorrhage in the right maxillary sinus. 2. No other  fractures. Electronically Signed   By: Lajean Manes M.D.   On: 10/23/2019 11:21    Procedures Procedures (including critical care time)  Medications Ordered in ED Medications  acetaminophen (TYLENOL) tablet 1,000 mg (1,000 mg Oral Given 10/23/19 1054)     Initial Impression / Assessment and Plan / ED Course  I have reviewed the triage vital signs and the nursing notes.  Pertinent labs & imaging results that were available during my care of the patient were reviewed by me and considered in my medical decision making (see chart for details).       Alyssa Montes is a 33 y.o. female who presents to ED for evaluation after fall this morning.  Tripped, not head and hit her right  cheek on the corner wooden bed frame.  No neurologic deficits on exam.  No midline cervical tenderness.  She does have significant swelling to the right mid face.  Imaging reviewed showing fractures of the anterior wall of the right maxillary sinus with associated hemorrhage in the right sinus.  No other fractures noted.  Discussed with ENT who recommends starting on prophylactic antibiotics and following up in the office.  Plan of care discussed with patient who agrees.  Reasons to return to ER and home care instructions were discussed and all questions answered.  Patient discussed with Dr. Rosalia Hammersay who agrees with treatment plan.    Final Clinical Impressions(s) / ED Diagnoses   Final diagnoses:  Closed fracture of maxillary sinus, initial encounter [facility](HCC)    ED Discharge Orders         Ordered    amoxicillin-clavulanate (AUGMENTIN) 875-125 MG tablet  Every 12 hours     10/23/19 1154           Lynanne Delgreco, Chase PicketJaime Pilcher, PA-C 10/23/19 1251    Margarita Grizzleay, Danielle, MD 10/23/19 [phone]

## 2019-10-23 NOTE — ED Triage Notes (Signed)
Pt tripped and fell when she got out of bed this morning.  Hit R cheek on wooden foot of bed.  + LOC for approx 20 sec.

## 2019-10-23 NOTE — Discharge Instructions (Signed)
It was my pleasure taking care of you today!   Take antibiotic as directed to prevent infection.   Ice to help with swelling. Tylenol and/or Motrin as needed for pain.   Call the facial ent doctor listed to schedule a follow up appointment.   Return to ER for new or worsening symptoms, any additional concerns.

## 2019-10-23 NOTE — ED Notes (Signed)
Patient transported to CT 

## 2019-10-25 DIAGNOSIS — S02401A Maxillary fracture, unspecified, initial encounter for closed fracture: Secondary | ICD-10-CM | POA: Diagnosis not present

## 2019-11-09 DIAGNOSIS — Z3200 Encounter for pregnancy test, result unknown: Secondary | ICD-10-CM | POA: Diagnosis not present

## 2019-11-09 DIAGNOSIS — Z3201 Encounter for pregnancy test, result positive: Secondary | ICD-10-CM | POA: Diagnosis not present

## 2019-11-11 DIAGNOSIS — Z3201 Encounter for pregnancy test, result positive: Secondary | ICD-10-CM | POA: Diagnosis not present

## 2019-11-11 DIAGNOSIS — Z3200 Encounter for pregnancy test, result unknown: Secondary | ICD-10-CM | POA: Diagnosis not present

## 2019-11-23 DIAGNOSIS — Z3200 Encounter for pregnancy test, result unknown: Secondary | ICD-10-CM | POA: Diagnosis not present

## 2019-12-07 DIAGNOSIS — O0900 Supervision of pregnancy with history of infertility, unspecified trimester: Secondary | ICD-10-CM | POA: Diagnosis not present

## 2019-12-15 DIAGNOSIS — Z3689 Encounter for other specified antenatal screening: Secondary | ICD-10-CM | POA: Diagnosis not present

## 2019-12-15 DIAGNOSIS — Z3401 Encounter for supervision of normal first pregnancy, first trimester: Secondary | ICD-10-CM | POA: Diagnosis not present

## 2019-12-15 LAB — OB RESULTS CONSOLE HIV ANTIBODY (ROUTINE TESTING): HIV: NONREACTIVE

## 2019-12-15 LAB — OB RESULTS CONSOLE ABO/RH: RH Type: POSITIVE

## 2019-12-15 LAB — OB RESULTS CONSOLE ANTIBODY SCREEN: Antibody Screen: NEGATIVE

## 2019-12-15 LAB — OB RESULTS CONSOLE RPR: RPR: NONREACTIVE

## 2019-12-15 LAB — OB RESULTS CONSOLE RUBELLA ANTIBODY, IGM: Rubella: IMMUNE

## 2019-12-15 LAB — OB RESULTS CONSOLE HEPATITIS B SURFACE ANTIGEN: Hepatitis B Surface Ag: NEGATIVE

## 2019-12-24 NOTE — L&D Delivery Note (Signed)
Delivery Note At 11:15 AM a viable and healthy female was delivered via Vaginal, Spontaneous (Presentation: Left Occiput Anterior).  APGAR: 8, 9; weight  .   Placenta status: Spontaneous, Intact.  Cord: 3 vessels with the following complications: None.  Cord pH: na  Anesthesia: Epidural Episiotomy: Median Lacerations: 2nd degree;Perineal Suture Repair: 2.0 3.0 vicryl rapide Est. Blood Loss (mL): 300  Mom to postpartum.  Baby to Couplet care / Skin to Skin.  Krist Rosenboom J 07/24/2020, 12:32 PM

## 2019-12-28 DIAGNOSIS — Z3A11 11 weeks gestation of pregnancy: Secondary | ICD-10-CM | POA: Diagnosis not present

## 2019-12-28 DIAGNOSIS — Z3689 Encounter for other specified antenatal screening: Secondary | ICD-10-CM | POA: Diagnosis not present

## 2019-12-28 DIAGNOSIS — O0901 Supervision of pregnancy with history of infertility, first trimester: Secondary | ICD-10-CM | POA: Diagnosis not present

## 2019-12-28 DIAGNOSIS — O0900 Supervision of pregnancy with history of infertility, unspecified trimester: Secondary | ICD-10-CM | POA: Diagnosis not present

## 2020-01-25 DIAGNOSIS — Z3A15 15 weeks gestation of pregnancy: Secondary | ICD-10-CM | POA: Diagnosis not present

## 2020-01-25 DIAGNOSIS — Z361 Encounter for antenatal screening for raised alphafetoprotein level: Secondary | ICD-10-CM | POA: Diagnosis not present

## 2020-01-25 DIAGNOSIS — O0902 Supervision of pregnancy with history of infertility, second trimester: Secondary | ICD-10-CM | POA: Diagnosis not present

## 2020-02-22 DIAGNOSIS — Z3A19 19 weeks gestation of pregnancy: Secondary | ICD-10-CM | POA: Diagnosis not present

## 2020-02-22 DIAGNOSIS — O0902 Supervision of pregnancy with history of infertility, second trimester: Secondary | ICD-10-CM | POA: Diagnosis not present

## 2020-02-22 DIAGNOSIS — Z363 Encounter for antenatal screening for malformations: Secondary | ICD-10-CM | POA: Diagnosis not present

## 2020-03-10 ENCOUNTER — Encounter: Payer: Self-pay | Admitting: Certified Nurse Midwife

## 2020-03-21 DIAGNOSIS — Z3A23 23 weeks gestation of pregnancy: Secondary | ICD-10-CM | POA: Diagnosis not present

## 2020-03-21 DIAGNOSIS — O0902 Supervision of pregnancy with history of infertility, second trimester: Secondary | ICD-10-CM | POA: Diagnosis not present

## 2020-04-18 DIAGNOSIS — Z3A27 27 weeks gestation of pregnancy: Secondary | ICD-10-CM | POA: Diagnosis not present

## 2020-04-18 DIAGNOSIS — O0902 Supervision of pregnancy with history of infertility, second trimester: Secondary | ICD-10-CM | POA: Diagnosis not present

## 2020-04-18 DIAGNOSIS — Z3689 Encounter for other specified antenatal screening: Secondary | ICD-10-CM | POA: Diagnosis not present

## 2020-04-21 DIAGNOSIS — O99810 Abnormal glucose complicating pregnancy: Secondary | ICD-10-CM | POA: Diagnosis not present

## 2020-05-03 DIAGNOSIS — Z23 Encounter for immunization: Secondary | ICD-10-CM | POA: Diagnosis not present

## 2020-05-03 DIAGNOSIS — O0903 Supervision of pregnancy with history of infertility, third trimester: Secondary | ICD-10-CM | POA: Diagnosis not present

## 2020-05-03 DIAGNOSIS — Z3A29 29 weeks gestation of pregnancy: Secondary | ICD-10-CM | POA: Diagnosis not present

## 2020-05-03 DIAGNOSIS — Z3689 Encounter for other specified antenatal screening: Secondary | ICD-10-CM | POA: Diagnosis not present

## 2020-05-17 DIAGNOSIS — Z3A31 31 weeks gestation of pregnancy: Secondary | ICD-10-CM | POA: Diagnosis not present

## 2020-05-17 DIAGNOSIS — O3663X0 Maternal care for excessive fetal growth, third trimester, not applicable or unspecified: Secondary | ICD-10-CM | POA: Diagnosis not present

## 2020-05-30 DIAGNOSIS — O3663X0 Maternal care for excessive fetal growth, third trimester, not applicable or unspecified: Secondary | ICD-10-CM | POA: Diagnosis not present

## 2020-05-30 DIAGNOSIS — Z3A33 33 weeks gestation of pregnancy: Secondary | ICD-10-CM | POA: Diagnosis not present

## 2020-06-15 DIAGNOSIS — O3663X0 Maternal care for excessive fetal growth, third trimester, not applicable or unspecified: Secondary | ICD-10-CM | POA: Diagnosis not present

## 2020-06-15 DIAGNOSIS — Z3A35 35 weeks gestation of pregnancy: Secondary | ICD-10-CM | POA: Diagnosis not present

## 2020-06-22 DIAGNOSIS — O3663X0 Maternal care for excessive fetal growth, third trimester, not applicable or unspecified: Secondary | ICD-10-CM | POA: Diagnosis not present

## 2020-06-22 DIAGNOSIS — Z3685 Encounter for antenatal screening for Streptococcus B: Secondary | ICD-10-CM | POA: Diagnosis not present

## 2020-06-22 DIAGNOSIS — Z3A36 36 weeks gestation of pregnancy: Secondary | ICD-10-CM | POA: Diagnosis not present

## 2020-06-22 LAB — OB RESULTS CONSOLE GBS: GBS: NEGATIVE

## 2020-06-30 DIAGNOSIS — O3663X0 Maternal care for excessive fetal growth, third trimester, not applicable or unspecified: Secondary | ICD-10-CM | POA: Diagnosis not present

## 2020-06-30 DIAGNOSIS — Z3A37 37 weeks gestation of pregnancy: Secondary | ICD-10-CM | POA: Diagnosis not present

## 2020-07-06 DIAGNOSIS — O3663X0 Maternal care for excessive fetal growth, third trimester, not applicable or unspecified: Secondary | ICD-10-CM | POA: Diagnosis not present

## 2020-07-06 DIAGNOSIS — Z3A38 38 weeks gestation of pregnancy: Secondary | ICD-10-CM | POA: Diagnosis not present

## 2020-07-13 ENCOUNTER — Encounter (HOSPITAL_COMMUNITY): Payer: Self-pay | Admitting: *Deleted

## 2020-07-13 ENCOUNTER — Telehealth (HOSPITAL_COMMUNITY): Payer: Self-pay | Admitting: *Deleted

## 2020-07-13 DIAGNOSIS — O3663X0 Maternal care for excessive fetal growth, third trimester, not applicable or unspecified: Secondary | ICD-10-CM | POA: Diagnosis not present

## 2020-07-13 DIAGNOSIS — Z3A39 39 weeks gestation of pregnancy: Secondary | ICD-10-CM | POA: Diagnosis not present

## 2020-07-13 NOTE — Telephone Encounter (Signed)
Preadmission screen  

## 2020-07-14 ENCOUNTER — Telehealth (HOSPITAL_COMMUNITY): Payer: Self-pay | Admitting: *Deleted

## 2020-07-14 ENCOUNTER — Encounter (HOSPITAL_COMMUNITY): Payer: Self-pay | Admitting: *Deleted

## 2020-07-14 NOTE — Telephone Encounter (Signed)
Preadmission screen  

## 2020-07-20 ENCOUNTER — Other Ambulatory Visit: Payer: Self-pay | Admitting: Obstetrics and Gynecology

## 2020-07-20 DIAGNOSIS — Z3A40 40 weeks gestation of pregnancy: Secondary | ICD-10-CM | POA: Diagnosis not present

## 2020-07-20 DIAGNOSIS — O3663X0 Maternal care for excessive fetal growth, third trimester, not applicable or unspecified: Secondary | ICD-10-CM | POA: Diagnosis not present

## 2020-07-21 DIAGNOSIS — O3663X0 Maternal care for excessive fetal growth, third trimester, not applicable or unspecified: Secondary | ICD-10-CM | POA: Diagnosis not present

## 2020-07-21 DIAGNOSIS — Z3A40 40 weeks gestation of pregnancy: Secondary | ICD-10-CM | POA: Diagnosis not present

## 2020-07-22 ENCOUNTER — Other Ambulatory Visit (HOSPITAL_COMMUNITY)
Admission: RE | Admit: 2020-07-22 | Discharge: 2020-07-22 | Disposition: A | Discharge status: Home / Self Care | Payer: BC Managed Care – PPO | Source: Ambulatory Visit | Attending: Obstetrics and Gynecology | Admitting: Obstetrics and Gynecology

## 2020-07-22 DIAGNOSIS — Z01812 Encounter for preprocedural laboratory examination: Secondary | ICD-10-CM | POA: Insufficient documentation

## 2020-07-22 DIAGNOSIS — D62 Acute posthemorrhagic anemia: Secondary | ICD-10-CM | POA: Diagnosis not present

## 2020-07-22 DIAGNOSIS — Z20822 Contact with and (suspected) exposure to covid-19: Secondary | ICD-10-CM | POA: Diagnosis not present

## 2020-07-22 DIAGNOSIS — O480 Post-term pregnancy: Secondary | ICD-10-CM | POA: Diagnosis not present

## 2020-07-22 DIAGNOSIS — Z23 Encounter for immunization: Secondary | ICD-10-CM | POA: Diagnosis not present

## 2020-07-22 DIAGNOSIS — Q620 Congenital hydronephrosis: Secondary | ICD-10-CM | POA: Diagnosis not present

## 2020-07-22 DIAGNOSIS — O9081 Anemia of the puerperium: Secondary | ICD-10-CM | POA: Diagnosis not present

## 2020-07-22 DIAGNOSIS — Z3A41 41 weeks gestation of pregnancy: Secondary | ICD-10-CM | POA: Diagnosis not present

## 2020-07-22 LAB — SARS CORONAVIRUS 2 (TAT 6-24 HRS): SARS Coronavirus 2: NEGATIVE

## 2020-07-23 ENCOUNTER — Inpatient Hospital Stay (HOSPITAL_COMMUNITY): Payer: BC Managed Care – PPO | Admitting: Anesthesiology

## 2020-07-23 ENCOUNTER — Encounter (HOSPITAL_COMMUNITY): Payer: Self-pay | Admitting: Obstetrics and Gynecology

## 2020-07-23 ENCOUNTER — Other Ambulatory Visit: Payer: Self-pay

## 2020-07-23 ENCOUNTER — Inpatient Hospital Stay (HOSPITAL_COMMUNITY)
Admission: AD | Admit: 2020-07-23 | Discharge: 2020-07-26 | DRG: 806 | Disposition: A | Discharge status: Home / Self Care | Payer: BC Managed Care – PPO | Attending: Obstetrics and Gynecology | Admitting: Obstetrics and Gynecology

## 2020-07-23 DIAGNOSIS — Z3A41 41 weeks gestation of pregnancy: Secondary | ICD-10-CM | POA: Diagnosis not present

## 2020-07-23 DIAGNOSIS — O9081 Anemia of the puerperium: Secondary | ICD-10-CM | POA: Diagnosis not present

## 2020-07-23 DIAGNOSIS — O480 Post-term pregnancy: Principal | ICD-10-CM | POA: Diagnosis present

## 2020-07-23 DIAGNOSIS — D62 Acute posthemorrhagic anemia: Secondary | ICD-10-CM | POA: Diagnosis not present

## 2020-07-23 DIAGNOSIS — Z20822 Contact with and (suspected) exposure to covid-19: Secondary | ICD-10-CM | POA: Diagnosis present

## 2020-07-23 DIAGNOSIS — Z3490 Encounter for supervision of normal pregnancy, unspecified, unspecified trimester: Secondary | ICD-10-CM | POA: Diagnosis present

## 2020-07-23 LAB — TYPE AND SCREEN
ABO/RH(D): B POS
Antibody Screen: NEGATIVE

## 2020-07-23 LAB — CBC
HCT: 38.8 % (ref 36.0–46.0)
Hemoglobin: 13.6 g/dL (ref 12.0–15.0)
MCH: 33.3 pg (ref 26.0–34.0)
MCHC: 35.1 g/dL (ref 30.0–36.0)
MCV: 95.1 fL (ref 80.0–100.0)
Platelets: 185 10*3/uL (ref 150–400)
RBC: 4.08 MIL/uL (ref 3.87–5.11)
RDW: 13.4 % (ref 11.5–15.5)
WBC: 8.9 10*3/uL (ref 4.0–10.5)
nRBC: 0 % (ref 0.0–0.2)

## 2020-07-23 MED ORDER — PHENYLEPHRINE 40 MCG/ML (10ML) SYRINGE FOR IV PUSH (FOR BLOOD PRESSURE SUPPORT)
80.0000 ug | PREFILLED_SYRINGE | INTRAVENOUS | Status: AC | PRN
  Administered 2020-07-23 – 2020-07-24 (×3): 80 ug via INTRAVENOUS

## 2020-07-23 MED ORDER — FAMOTIDINE 20 MG PO TABS
20.0000 mg | ORAL_TABLET | Freq: Two times a day (BID) | ORAL | Status: DC
  Administered 2020-07-23: 20 mg via ORAL
  Filled 2020-07-23: qty 1

## 2020-07-23 MED ORDER — LACTATED RINGERS IV SOLN: INTRAVENOUS | Status: DC

## 2020-07-23 MED ORDER — LACTATED RINGERS IV SOLN: 500.0000 mL | INTRAVENOUS | Status: DC | PRN

## 2020-07-23 MED ORDER — OXYTOCIN-SODIUM CHLORIDE 30-0.9 UT/500ML-% IV SOLN
2.5000 [IU]/h | INTRAVENOUS | Status: DC
  Filled 2020-07-23: qty 500

## 2020-07-23 MED ORDER — LIDOCAINE HCL (PF) 1 % IJ SOLN: 30.0000 mL | INTRAMUSCULAR | Status: DC | PRN

## 2020-07-23 MED ORDER — SOD CITRATE-CITRIC ACID 500-334 MG/5ML PO SOLN
30.0000 mL | ORAL | Status: DC | PRN
  Administered 2020-07-23: 30 mL via ORAL
  Filled 2020-07-23: qty 30

## 2020-07-23 MED ORDER — ACETAMINOPHEN 325 MG PO TABS: 650.0000 mg | ORAL_TABLET | ORAL | Status: DC | PRN

## 2020-07-23 MED ORDER — LACTATED RINGERS IV SOLN
500.0000 mL | Freq: Once | INTRAVENOUS | Status: AC
  Administered 2020-07-23: 500 mL via INTRAVENOUS

## 2020-07-23 MED ORDER — FENTANYL CITRATE (PF) 100 MCG/2ML IJ SOLN
100.0000 ug | INTRAMUSCULAR | Status: DC | PRN
  Administered 2020-07-23 (×2): 100 ug via INTRAVENOUS
  Filled 2020-07-23 (×2): qty 2

## 2020-07-23 MED ORDER — EPHEDRINE 5 MG/ML INJ: 10.0000 mg | INTRAVENOUS | Status: DC | PRN

## 2020-07-23 MED ORDER — ONDANSETRON HCL 4 MG/2ML IJ SOLN: 4.0000 mg | Freq: Four times a day (QID) | INTRAMUSCULAR | Status: DC | PRN

## 2020-07-23 MED ORDER — DIPHENHYDRAMINE HCL 50 MG/ML IJ SOLN: 12.5000 mg | INTRAMUSCULAR | Status: DC | PRN

## 2020-07-23 MED ORDER — FLEET ENEMA 7-19 GM/118ML RE ENEM: 1.0000 | ENEMA | RECTAL | Status: DC | PRN

## 2020-07-23 MED ORDER — PHENYLEPHRINE 40 MCG/ML (10ML) SYRINGE FOR IV PUSH (FOR BLOOD PRESSURE SUPPORT)
80.0000 ug | PREFILLED_SYRINGE | INTRAVENOUS | Status: DC | PRN
  Administered 2020-07-23: 80 ug via INTRAVENOUS
  Filled 2020-07-23 (×2): qty 10

## 2020-07-23 MED ORDER — OXYCODONE-ACETAMINOPHEN 5-325 MG PO TABS: 2.0000 | ORAL_TABLET | ORAL | Status: DC | PRN

## 2020-07-23 MED ORDER — OXYCODONE-ACETAMINOPHEN 5-325 MG PO TABS: 1.0000 | ORAL_TABLET | ORAL | Status: DC | PRN

## 2020-07-23 MED ORDER — OXYTOCIN BOLUS FROM INFUSION
333.0000 mL | Freq: Once | INTRAVENOUS | Status: AC
  Administered 2020-07-24: 333 mL via INTRAVENOUS

## 2020-07-23 MED ORDER — FENTANYL-BUPIVACAINE-NACL 0.5-0.125-0.9 MG/250ML-% EP SOLN
12.0000 mL/h | EPIDURAL | Status: DC | PRN
  Filled 2020-07-23: qty 250

## 2020-07-23 NOTE — H&P (Signed)
Alyssa Montes is a 34 y.o. female presenting for prodromal labor postdates. OB History    Gravida  1   Para  0   Term  0   Preterm  0   AB  0   Living  0     SAB  0   TAB  0   Ectopic  0   Multiple  0   Live Births  0          Past Medical History:  Diagnosis Date  . Dysmenorrhea   . PCOS (polycystic ovarian syndrome)    Past Surgical History:  Procedure Laterality Date  . 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 FOOT SURGERY Right 2008   Family History: family history includes Heart disease in her maternal grandfather and paternal grandfather; Hyperlipidemia in her father; Stroke in her maternal grandfather and paternal grandfather. Social History:  reports that she has never smoked. She has never used smokeless tobacco. She reports current alcohol use. She reports that she does not use drugs.     Maternal Diabetes: No Genetic Screening: Normal Maternal Ultrasounds/Referrals: Normal Fetal Ultrasounds or other Referrals:  None Maternal Substance Abuse:  No Significant Maternal Medications:  None Significant Maternal Lab Results:  Group B Strep negative Other Comments:  None  Review of Systems  Constitutional: Negative.   All other systems reviewed and are negative.  Maternal Medical History:  Reason for admission: Contractions.   Contractions: Onset was 3-5 hours ago.   Frequency: irregular.   Perceived severity is mild.    Fetal activity: Perceived fetal activity is normal.   Last perceived fetal movement was within the past hour.    Prenatal Complications - Diabetes: none.    Dilation: 3 Effacement (%): 60 Station: -2 Exam by:: Marvel Plan RN  Blood pressure 119/79, pulse 88, temperature 98.6 F (37 C), resp. rate 17, height 5\' 5"  (1.651 m), weight 77 kg, last menstrual period 10/09/2019, SpO2 100 %. Maternal Exam:  Uterine Assessment: Contraction strength is moderate.  Abdomen: Patient reports no abdominal tenderness.  Fetal presentation: vertex  Introitus: Normal vulva. Normal vagina.  Ferning test: not done.  Nitrazine test: not done. Amniotic fluid character: not assessed.  Pelvis: questionable for delivery.   Cervix: Cervix evaluated by digital exam.     Physical Exam Vitals and nursing note reviewed.  Constitutional:      Appearance: Normal appearance.  HENT:     Head: Normocephalic and atraumatic.  Cardiovascular:     Rate and Rhythm: Normal rate and regular rhythm.  Abdominal:     General: Bowel sounds are normal.     Palpations: Abdomen is soft.  Genitourinary:    General: Normal vulva.  Musculoskeletal:     Cervical back: Normal range of motion and neck supple.  Skin:    General: Skin is warm and dry.  Neurological:     General: No focal deficit present.     Mental Status: She is alert.  Psychiatric:        Behavior: Behavior normal.     Prenatal labs: ABO, Rh: --/--/B POS (08/01 1830) Antibody: NEG (08/01 1830) Rubella: Immune (12/23 0000) RPR: Nonreactive (12/23 0000)  HBsAg: Negative (12/23 0000)  HIV: Non-reactive (12/23 0000)  GBS: Negative/-- (07/01 0000)   Assessment/Plan: Postdates IUP Early labor  Admit   Alyssa Montes J 07/23/2020, 10:13 PM

## 2020-07-23 NOTE — Anesthesia Preprocedure Evaluation (Signed)
Anesthesia Evaluation  Patient identified by MRN, date of birth, ID band Patient awake    Reviewed: Allergy & Precautions, Patient's Chart, lab work & pertinent test results  Airway Mallampati: II  TM Distance: >3 FB Neck ROM: Full    Dental no notable dental hx.    Pulmonary neg pulmonary ROS,    Pulmonary exam normal breath sounds clear to auscultation       Cardiovascular negative cardio ROS Normal cardiovascular exam Rhythm:Regular Rate:Normal     Neuro/Psych negative neurological ROS  negative psych ROS   GI/Hepatic negative GI ROS, Neg liver ROS,   Endo/Other  negative endocrine ROS  Renal/GU negative Renal ROS   PCOS, dysmenorrhea    Musculoskeletal negative musculoskeletal ROS (+)   Abdominal   Peds negative pediatric ROS (+)  Hematology negative hematology ROS (+) hct 38.8, plt 185   Anesthesia Other Findings   Reproductive/Obstetrics (+) Pregnancy G1P0                             Anesthesia Physical Anesthesia Plan  ASA: II and emergent  Anesthesia Plan: Epidural   Post-op Pain Management:    Induction:   PONV Risk Score and Plan: 2  Airway Management Planned: Natural Airway  Additional Equipment: None  Intra-op Plan:   Post-operative Plan:   Informed Consent: I have reviewed the patients History and Physical, chart, labs and discussed the procedure including the risks, benefits and alternatives for the proposed anesthesia with the patient or authorized representative who has indicated his/her understanding and acceptance.       Plan Discussed with:   Anesthesia Plan Comments:         Anesthesia Quick Evaluation

## 2020-07-23 NOTE — MAU Note (Signed)
Alyssa Montes is a 34 y.o. at [redacted]w[redacted]d here in MAU reporting: woke up with cramping and it has progressed throughout the day. They are coming about every 5 minutes. No bleeding, no LOF. +FM  Onset of complaint: today  Pain score: 6/10  Vitals:   07/23/20 1744  BP: (!) 129/86  Pulse: 79  Resp: 16  Temp: 97.8 F (36.6 C)  SpO2: 100%     FHT: +FM  Lab orders placed from triage: none

## 2020-07-24 ENCOUNTER — Encounter (HOSPITAL_COMMUNITY): Payer: Self-pay | Admitting: Obstetrics and Gynecology

## 2020-07-24 ENCOUNTER — Inpatient Hospital Stay (HOSPITAL_COMMUNITY)
Admission: AD | Admit: 2020-07-24 | Payer: BC Managed Care – PPO | Source: Home / Self Care | Admitting: Obstetrics and Gynecology

## 2020-07-24 ENCOUNTER — Inpatient Hospital Stay (HOSPITAL_COMMUNITY): Payer: BC Managed Care – PPO

## 2020-07-24 DIAGNOSIS — Z3490 Encounter for supervision of normal pregnancy, unspecified, unspecified trimester: Secondary | ICD-10-CM | POA: Diagnosis present

## 2020-07-24 LAB — GLUCOSE, CAPILLARY: Glucose-Capillary: 93 mg/dL (ref 70–99)

## 2020-07-24 LAB — RPR: RPR Ser Ql: NONREACTIVE

## 2020-07-24 LAB — ABO/RH: ABO/RH(D): B POS

## 2020-07-24 MED ORDER — TERBUTALINE SULFATE 1 MG/ML IJ SOLN: 0.2500 mg | Freq: Once | INTRAMUSCULAR | Status: DC | PRN

## 2020-07-24 MED ORDER — OXYTOCIN BOLUS FROM INFUSION: 333.0000 mL | Freq: Once | INTRAVENOUS | Status: DC

## 2020-07-24 MED ORDER — ONDANSETRON HCL 4 MG PO TABS: 4.0000 mg | ORAL_TABLET | ORAL | Status: DC | PRN

## 2020-07-24 MED ORDER — ACETAMINOPHEN 325 MG PO TABS: 650.0000 mg | ORAL_TABLET | ORAL | Status: DC | PRN

## 2020-07-24 MED ORDER — TETANUS-DIPHTH-ACELL PERTUSSIS 5-2.5-18.5 LF-MCG/0.5 IM SUSP: 0.5000 mL | Freq: Once | INTRAMUSCULAR | Status: DC

## 2020-07-24 MED ORDER — LACTATED RINGERS IV SOLN
500.0000 mL | INTRAVENOUS | Status: DC | PRN
Start: 2020-07-24 — End: 2020-07-24

## 2020-07-24 MED ORDER — SODIUM CHLORIDE (PF) 0.9 % IJ SOLN
INTRAMUSCULAR | Status: DC | PRN
  Administered 2020-07-23: 12 mL/h via EPIDURAL

## 2020-07-24 MED ORDER — MISOPROSTOL 100 MCG PO TABS
25.0000 ug | ORAL_TABLET | ORAL | Status: DC | PRN
Start: 2020-07-24 — End: 2020-07-24

## 2020-07-24 MED ORDER — COCONUT OIL OIL
1.0000 "application " | TOPICAL_OIL | Status: DC | PRN
  Administered 2020-07-26: 1 via TOPICAL

## 2020-07-24 MED ORDER — ONDANSETRON HCL 4 MG/2ML IJ SOLN: 4.0000 mg | INTRAMUSCULAR | Status: DC | PRN

## 2020-07-24 MED ORDER — OXYCODONE-ACETAMINOPHEN 5-325 MG PO TABS: 2.0000 | ORAL_TABLET | ORAL | Status: DC | PRN

## 2020-07-24 MED ORDER — SENNOSIDES-DOCUSATE SODIUM 8.6-50 MG PO TABS
2.0000 | ORAL_TABLET | ORAL | Status: DC
  Administered 2020-07-25 – 2020-07-26 (×2): 2 via ORAL
  Filled 2020-07-24 (×2): qty 2

## 2020-07-24 MED ORDER — DIPHENHYDRAMINE HCL 25 MG PO CAPS: 25.0000 mg | ORAL_CAPSULE | Freq: Four times a day (QID) | ORAL | Status: DC | PRN

## 2020-07-24 MED ORDER — AMMONIA AROMATIC IN INHA
RESPIRATORY_TRACT | Status: AC
  Administered 2020-07-24: 3 mL via NASAL
  Filled 2020-07-24: qty 10

## 2020-07-24 MED ORDER — DIBUCAINE (PERIANAL) 1 % EX OINT: 1.0000 "application " | TOPICAL_OINTMENT | CUTANEOUS | Status: DC | PRN

## 2020-07-24 MED ORDER — OXYTOCIN-SODIUM CHLORIDE 30-0.9 UT/500ML-% IV SOLN: 2.5000 [IU]/h | INTRAVENOUS | Status: DC

## 2020-07-24 MED ORDER — LIDOCAINE HCL (PF) 1 % IJ SOLN
30.0000 mL | INTRAMUSCULAR | Status: DC | PRN
Start: 2020-07-24 — End: 2020-07-24

## 2020-07-24 MED ORDER — ZOLPIDEM TARTRATE 5 MG PO TABS: 5.0000 mg | ORAL_TABLET | Freq: Every evening | ORAL | Status: DC | PRN

## 2020-07-24 MED ORDER — SOD CITRATE-CITRIC ACID 500-334 MG/5ML PO SOLN: 30.0000 mL | ORAL | Status: DC | PRN

## 2020-07-24 MED ORDER — METHYLERGONOVINE MALEATE 0.2 MG/ML IJ SOLN: 0.2000 mg | INTRAMUSCULAR | Status: DC | PRN

## 2020-07-24 MED ORDER — SIMETHICONE 80 MG PO CHEW
80.0000 mg | CHEWABLE_TABLET | ORAL | Status: DC | PRN
  Filled 2020-07-24: qty 1

## 2020-07-24 MED ORDER — LIDOCAINE HCL (PF) 1 % IJ SOLN
INTRAMUSCULAR | Status: DC | PRN
  Administered 2020-07-23: 10 mL via EPIDURAL
  Administered 2020-07-23: 2 mL via EPIDURAL

## 2020-07-24 MED ORDER — METHYLERGONOVINE MALEATE 0.2 MG PO TABS: 0.2000 mg | ORAL_TABLET | ORAL | Status: DC | PRN

## 2020-07-24 MED ORDER — IBUPROFEN 600 MG PO TABS
600.0000 mg | ORAL_TABLET | Freq: Four times a day (QID) | ORAL | Status: DC
  Administered 2020-07-24 – 2020-07-26 (×9): 600 mg via ORAL
  Filled 2020-07-24 (×9): qty 1

## 2020-07-24 MED ORDER — FAMOTIDINE 20 MG PO TABS
20.0000 mg | ORAL_TABLET | Freq: Two times a day (BID) | ORAL | Status: DC
  Administered 2020-07-25 – 2020-07-26 (×2): 20 mg via ORAL
  Filled 2020-07-24 (×4): qty 1

## 2020-07-24 MED ORDER — WITCH HAZEL-GLYCERIN EX PADS: 1.0000 "application " | MEDICATED_PAD | CUTANEOUS | Status: DC | PRN

## 2020-07-24 MED ORDER — PRENATAL MULTIVITAMIN CH
1.0000 | ORAL_TABLET | Freq: Every day | ORAL | Status: DC
  Administered 2020-07-25 – 2020-07-26 (×2): 1 via ORAL
  Filled 2020-07-24 (×2): qty 1

## 2020-07-24 MED ORDER — ONDANSETRON HCL 4 MG/2ML IJ SOLN
4.0000 mg | Freq: Four times a day (QID) | INTRAMUSCULAR | Status: DC | PRN
Start: 2020-07-24 — End: 2020-07-24

## 2020-07-24 MED ORDER — ACETAMINOPHEN 325 MG PO TABS
650.0000 mg | ORAL_TABLET | ORAL | Status: DC | PRN
  Administered 2020-07-24 – 2020-07-25 (×2): 650 mg via ORAL
  Filled 2020-07-24 (×3): qty 2

## 2020-07-24 MED ORDER — LACTATED RINGERS IV SOLN: INTRAVENOUS | Status: DC

## 2020-07-24 MED ORDER — OXYCODONE-ACETAMINOPHEN 5-325 MG PO TABS: 1.0000 | ORAL_TABLET | ORAL | Status: DC | PRN

## 2020-07-24 MED ORDER — BENZOCAINE-MENTHOL 20-0.5 % EX AERO
1.0000 "application " | INHALATION_SPRAY | CUTANEOUS | Status: DC | PRN
  Administered 2020-07-24 – 2020-07-25 (×2): 1 via TOPICAL
  Filled 2020-07-24 (×2): qty 56

## 2020-07-24 NOTE — Lactation Note (Signed)
This note was copied from a baby's chart. Lactation Consultation Note  Patient Name: Alyssa Montes [id number] Date: 07/24/2020 Reason for consult: Initial assessment;1st time breastfeeding;Term;Maternal endocrine disorder Type of Endocrine Disorder?: PCOS P1, 5 hour term female infant. Per mom, infant latched briefly in L&D and BF in room for 20 minutes with RN assistance. LC entered room, mom was attempting to latch infant. Mom latched infant on her left breast using the cross cradle hold, LC ask mom to wait until infants mouth is wide with tongue down, bring infant chin first towards breast, infant BF for 10 minutes. Per mom, she feels tug but no pain with latch, mom's nipples were rounded and not pinched when infant released breast. LC discussed hand expression and mom taught back easily expressing 4 mls of colostrum that was spoon fed to infant. LC assisted dad with burping infant and then dad did STS with infant as LC left the room.  Mom knows to BF according to hunger cues, on demand, 8 to 12+ times within 24 hours. Parents will continue to do as much STS with infant as possible. Mom knows to call RN or LC if she needs assistance with latching infant at breast. Mom made aware of O/P services, breastfeeding support groups, community resources, and our phone # for post-discharge questions.   Maternal Data Formula Feeding for Exclusion: No Has patient been taught Hand Expression?: Yes Does the patient have breastfeeding experience prior to this delivery?: No  Feeding Feeding Type: Breast Fed  LATCH Score Latch: Grasps breast easily, tongue down, lips flanged, rhythmical sucking.  Audible Swallowing: Spontaneous and intermittent  Type of Nipple: Everted at rest and after stimulation  Comfort (Breast/Nipple): Soft / non-tender  Hold (Positioning): Assistance needed to correctly position infant at breast and maintain latch.  LATCH Score: 9  Interventions Interventions:  Breast feeding basics reviewed;Assisted with latch;Breast compression;Skin to skin;Adjust position;Breast massage;Support pillows;Hand express;Position options;Expressed milk  Lactation Tools Discussed/Used WIC Program: No   Consult Status Consult Status: Follow-up Date: 07/25/20 Follow-up type: In-patient    Danelle Earthly 07/24/2020, 4:53 PM

## 2020-07-24 NOTE — Progress Notes (Signed)
FOB calls out for help, upon entering the room he reports pt passed out.  PT now awake but looking dazed. Vitals reassessed all wnl. Pt quickly normalizes. Anesthesia notified as well as Dr Billy Coast.  Dr Hyacinth Meeker believes this to be a vagal response.  No orders given.

## 2020-07-24 NOTE — Progress Notes (Signed)
10 Instruments 5 9*9 5 4*18 2 Injectables  *No Sharps Pad* 

## 2020-07-24 NOTE — Progress Notes (Addendum)
Pt up to bathrroom with stedy. Pt states feeling "shaky" Pt back to bed. VS and assessment WNL. Pt visible shaking. Blankets on pt. Will continue to monitor.

## 2020-07-24 NOTE — Anesthesia Procedure Notes (Signed)
Epidural Patient location during procedure: OB Start time: 07/23/2020 11:19 PM End time: 07/23/2020 11:30 PM  Staffing Anesthesiologist: Lannie Fields, DO Performed: anesthesiologist   Preanesthetic Checklist Completed: patient identified, IV checked, risks and benefits discussed, monitors and equipment checked, pre-op evaluation and timeout performed  Epidural Patient position: sitting Prep: DuraPrep and site prepped and draped Patient monitoring: continuous pulse ox, blood pressure, heart rate and cardiac monitor Approach: midline Location: L3-L4 Injection technique: LOR air  Needle:  Needle type: Tuohy  Needle gauge: 17 G Needle length: 9 cm Needle insertion depth: 4 cm Catheter type: closed end flexible Catheter size: 19 Gauge Catheter at skin depth: 9 cm Test dose: negative  Assessment Sensory level: T8 Events: blood not aspirated, injection not painful, no injection resistance, no paresthesia and negative IV test  Additional Notes Patient identified. Risks/Benefits/Options discussed with patient including but not limited to bleeding, infection, nerve damage, paralysis, failed block, incomplete pain control, headache, blood pressure changes, nausea, vomiting, reactions to medication both or allergic, itching and postpartum back pain. Confirmed with bedside nurse the patient's most recent platelet count. Confirmed with patient that they are not currently taking any anticoagulation, have any bleeding history or any family history of bleeding disorders. Patient expressed understanding and wished to proceed. All questions were answered. Sterile technique was used throughout the entire procedure. Please see nursing notes for vital signs. Test dose was given through epidural catheter and negative prior to continuing to dose epidural or start infusion. Warning signs of high block given to the patient including shortness of breath, tingling/numbness in hands, complete motor block,  or any concerning symptoms with instructions to call for help. Patient was given instructions on fall risk and not to get out of bed. All questions and concerns addressed with instructions to call with any issues or inadequate analgesia.  Reason for block:procedure for pain

## 2020-07-25 DIAGNOSIS — D62 Acute posthemorrhagic anemia: Secondary | ICD-10-CM | POA: Diagnosis not present

## 2020-07-25 LAB — CBC
HCT: 28.5 % — ABNORMAL LOW (ref 36.0–46.0)
Hemoglobin: 9.8 g/dL — ABNORMAL LOW (ref 12.0–15.0)
MCH: 33.1 pg (ref 26.0–34.0)
MCHC: 34.4 g/dL (ref 30.0–36.0)
MCV: 96.3 fL (ref 80.0–100.0)
Platelets: 124 10*3/uL — ABNORMAL LOW (ref 150–400)
RBC: 2.96 MIL/uL — ABNORMAL LOW (ref 3.87–5.11)
RDW: 13.6 % (ref 11.5–15.5)
WBC: 7.7 10*3/uL (ref 4.0–10.5)
nRBC: 0 % (ref 0.0–0.2)

## 2020-07-25 MED ORDER — MAGNESIUM OXIDE 400 (241.3 MG) MG PO TABS
400.0000 mg | ORAL_TABLET | Freq: Every day | ORAL | Status: DC
  Administered 2020-07-25 – 2020-07-26 (×2): 400 mg via ORAL
  Filled 2020-07-25 (×2): qty 1

## 2020-07-25 MED ORDER — POLYSACCHARIDE IRON COMPLEX 150 MG PO CAPS
150.0000 mg | ORAL_CAPSULE | Freq: Every day | ORAL | Status: DC
  Administered 2020-07-25 – 2020-07-26 (×2): 150 mg via ORAL
  Filled 2020-07-25 (×2): qty 1

## 2020-07-25 NOTE — Anesthesia Postprocedure Evaluation (Signed)
Anesthesia Post Note  Patient: Alyssa Montes  Procedure(s) Performed: AN AD HOC LABOR EPIDURAL     Patient location during evaluation: Mother Baby Anesthesia Type: Epidural Level of consciousness: awake, awake and alert and oriented Pain management: pain level controlled Vital Signs Assessment: post-procedure vital signs reviewed and stable Respiratory status: spontaneous breathing and respiratory function stable Cardiovascular status: blood pressure returned to baseline Postop Assessment: no headache, epidural receding, patient able to bend at knees, adequate PO intake, no backache, no apparent nausea or vomiting and able to ambulate Anesthetic complications: no   No complications documented.  Last Vitals:  Vitals:   07/25/20 0000 07/25/20 0500  BP: 140/81 132/78  Pulse: 78 73  Resp: 17 16  Temp: 36.8 C 36.6 C  SpO2: 100% 99%    Last Pain:  Vitals:   07/25/20 0500  TempSrc: Oral  PainSc:    Pain Goal: Patients Stated Pain Goal: 1 (07/25/20 0205)                 Cleda Clarks

## 2020-07-25 NOTE — Progress Notes (Addendum)
PPD # 1 S/P NSVD  Live born female  Birth Weight: 8 lb 14.3 oz (4034 g) APGAR: 8, 9  Newborn Delivery   Birth date/time: 07/24/2019 11:15:00 Delivery type: Vaginal, Spontaneous     Baby name: Alyssa Montes Delivering provider: Olivia Mackie  Episiotomy:Median   Lacerations:2nd degree;Perineal   Feeding: breast  Pain control at delivery: Epidural   S:  Reports feeling well             Tolerating po/ No nausea or vomiting             Bleeding is moderate             Pain controlled with ibuprofen (OTC)             Up ad lib / ambulatory / voiding without difficulties   O:  A & O x 3, in no apparent distress              VS:  Vitals:   07/24/20 1839 07/24/20 2040 07/25/20 0000 07/25/20 0500  BP: 122/83 139/84 140/81 132/78  Pulse: 79 75 78 73  Resp: 16 18 17 16   Temp: 98.6 F (37 C) 98.5 F (36.9 C) 98.2 F (36.8 C) 97.9 F (36.6 C)  TempSrc: Axillary Oral Oral Oral  SpO2: 99% 100% 100% 99%  Weight:      Height:        LABS:  Recent Labs    07/23/20 1830 07/25/20 0546  WBC 8.9 7.7  HGB 13.6 9.8*  HCT 38.8 28.5*  PLT 185 124*    Blood type: --/--/B POS Performed at [facility], [address] [place], [place]  [phone] (79/89)  Rubella: Immune (12/23 0000)   I&O: I/O last 3 completed shifts: In: -  Out: 1100 [Urine:800; Blood:300]          No intake/output data recorded.   Gen: AAO x 3, NAD  Abdomen: soft, non-tender, non-distended             Fundus: firm, non-tender, U-1  Perineum: repair intact  Lochia: small  Extremities: no edema, no calf pain or tenderness    A/P: PPD # 1 33 y.o., G1P1001   Principal Problem:   Postpartum care following vaginal delivery 8/2 Active Problems:   Normal labor   SVD (spontaneous vaginal delivery) 8/2   Second degree perineal laceration   Encounter for induction of labor   Acute blood loss anemia  - asymptomatic  - started oral Fe and Mag Ox  Doing well - stable status  Routine post partum  orders  Anticipate discharge tomorrow    10/2, MSN, CNM 07/25/2020, 12:08 PM

## 2020-07-25 NOTE — Lactation Note (Signed)
This note was copied from a baby's chart. Lactation Consultation Note  Patient Name: Alyssa Montes Date: 07/25/2020 Reason for consult: Follow-up assessment;Term;Infant weight loss;Nipple pain/trauma Type of Endocrine Disorder?: PCOS  MBURN asked the LC to see this mom , P 1  As LC entered the room mom mentioned the baby had just fed for 15 mins and still hungry. LC offered to assist and show her the football position and worked on positioning and depth at the breast. Baby latched with depth and fed for 20 mins with multiple swallows , then increased with breast compressions and moist warm compress above the baby's mouth. Baby nipple well rounded when baby released and was still hungry. LC assisted mom to latch on the right breast working on positioning baby and support and baby fed 5 mins and came off.  Dad settled baby.  LC noted mom to have areola edema. LC provided breast shells while awake between feedings except when sleeping, and hand pump if needed.  LC checked the flange and the #24 F fits well/ per mom comfortable.  LC reviewed the reverse pressure exercise for after breast massage , hand express to elongate the nipple areola complex for a deeper latch.  Mom brought her DEBP - LC recommended reading the owners manual for settings , showed mom and dad the rubber membrane that has to washed when using the DEBP, along with pump pieces and #24 F and #28 F.   LC praised mom for her breast feeding efforts and dad for his support.    Maternal Data Has patient been taught Hand Expression?: Yes (LC reviewed / easily hand express large drops after feeding)  Feeding Feeding Type: Breast Fed  LATCH Score Latch: Grasps breast easily, tongue down, lips flanged, rhythmical sucking.  Audible Swallowing: Spontaneous and intermittent  Type of Nipple: Everted at rest and after stimulation  Comfort (Breast/Nipple): Soft / non-tender  Hold (Positioning): Assistance needed to  correctly position infant at breast and maintain latch.  LATCH Score: 9  Interventions Interventions: Breast feeding basics reviewed;Assisted with latch;Skin to skin;Breast massage;Hand express;Breast compression;Adjust position;Support pillows;Position options;Shells;Hand pump  Lactation Tools Discussed/Used Tools: Shells;Pump Flange Size: 24 Shell Type: Inverted Breast pump type: Manual Pump Review: Setup, frequency, and cleaning Initiated by:: MAI Date initiated:: 07/25/20   Consult Status Consult Status: Follow-up Date: 07/26/20 Follow-up type: In-patient    Alyssa Montes Amon Costilla 07/25/2020, 5:26 PM

## 2020-07-26 MED ORDER — MAGNESIUM OXIDE -MG SUPPLEMENT 400 (240 MG) MG PO TABS
400.0000 mg | ORAL_TABLET | Freq: Every day | ORAL | Status: DC
Start: 2020-07-26 — End: 2024-09-28

## 2020-07-26 MED ORDER — BENZOCAINE-MENTHOL 20-0.5 % EX AERO: 1.0000 "application " | INHALATION_SPRAY | CUTANEOUS | Status: DC | PRN

## 2020-07-26 MED ORDER — COCONUT OIL OIL: 1.0000 "application " | TOPICAL_OIL | 0 refills | Status: DC | PRN

## 2020-07-26 MED ORDER — POLYSACCHARIDE IRON COMPLEX 150 MG PO CAPS: 150.0000 mg | ORAL_CAPSULE | Freq: Every day | ORAL | Status: DC

## 2020-07-26 MED ORDER — ACETAMINOPHEN 325 MG PO TABS: 650.0000 mg | ORAL_TABLET | ORAL | Status: DC | PRN

## 2020-07-26 MED ORDER — IBUPROFEN 600 MG PO TABS: 600.0000 mg | ORAL_TABLET | Freq: Four times a day (QID) | ORAL | 0 refills | Status: DC

## 2020-07-26 NOTE — Discharge Summary (Signed)
OB Discharge Summary  Patient Name: Alyssa Montes DOB: [birth year] MRN: [id number]  Date of admission: 07/23/2020 Delivering provider: Olivia Mackie   Admitting diagnosis: Normal labor [O80, Z37.9] Encounter for induction of labor [Z34.90] Intrauterine pregnancy: [redacted]w[redacted]d     Secondary diagnosis: Patient Active Problem List   Diagnosis Date Noted  . Acute blood loss anemia 07/25/2020  . SVD (spontaneous vaginal delivery) 8/2 07/24/2020  . Second degree perineal laceration 07/24/2020  . Postpartum care following vaginal delivery 8/2 07/24/2020  . Encounter for induction of labor 07/24/2020  . Normal labor 07/23/2020   Additional problems:none   Date of discharge: 07/26/2020   Discharge diagnosis: Principal Problem:   Postpartum care following vaginal delivery 8/2 Active Problems:   Normal labor   SVD (spontaneous vaginal delivery) 8/2   Second degree perineal laceration   Encounter for induction of labor   Acute blood loss anemia                                                              Post partum procedures:none  Augmentation: AROM Pain control: Epidural  Laceration:2nd degree;Perineal  Episiotomy:Median  Complications: None  Hospital course:  Onset of Labor With Vaginal Delivery      34 y.o. yo G1P1001 at [redacted]w[redacted]d was admitted in Latent Labor on 07/23/2020. Patient had an uncomplicated labor course as follows:  Membrane Rupture Time/Date: 8:15 AM ,07/24/2020   Delivery Method:Vaginal, Spontaneous  Episiotomy: Median  Lacerations:  2nd degree;Perineal  Patient had an uncomplicated postpartum course.  She is ambulating, tolerating a regular diet, passing flatus, and urinating well. Patient is discharged home in stable condition on 07/26/20.  Newborn Data: Birth date:07/24/2020  Birth time:11:15 AM  Gender:Female  Living status:Living  Apgars:8 ,9  Weight:4034 g   Physical exam  Vitals:   07/25/20 0500 07/25/20 1500 07/25/20 2000 07/26/20 0621  BP: 132/78 114/75  111/73 (!) 103/54  Pulse: 73 75 74 71  Resp: 16 16 16 16   Temp: 97.9 F (36.6 C) 98.1 F (36.7 C) 98.2 F (36.8 C) 98 F (36.7 C)  TempSrc: Oral Oral Oral Axillary  SpO2: 99%  100% 100%  Weight:      Height:       General: cooperative Lochia: appropriate Uterine Fundus: firm Incision: N/A Perineum: repair intact, no edema DVT Evaluation: No significant calf/ankle edema. Labs: Lab Results  Component Value Date   WBC 7.7 07/25/2020   HGB 9.8 (L) 07/25/2020   HCT 28.5 (L) 07/25/2020   MCV 96.3 07/25/2020   PLT 124 (L) 07/25/2020   CMP Latest Ref Rng & Units 10/29/2013  BUN 4 - 21 mg/dL 13  Creatinine 0.5 - 1.1 mg/dL 0.8  Sodium 13/06/2013 - 354 mmol/L 139  Potassium 3.4 - 5.3 mmol/L 4.3  Alkaline Phos 25 - 125 U/L 60  AST 13 - 35 U/L 24  ALT 7 - 35 U/L 25   Edinburgh Postnatal Depression Scale Screening Tool 07/24/2020  I have been able to laugh and see the funny side of things. (No Data)     Discharge instruction:  per After Visit Summary,  [facility] booklet and  "Understanding Mother & Baby Care" hospital booklet  After Visit Meds:  Allergies as of 07/26/2020   No Known Allergies     Medication List  STOP taking these medications   amoxicillin-clavulanate 875-125 MG tablet Commonly known as: AUGMENTIN   famotidine 20 MG tablet Commonly known as: PEPCID     TAKE these medications   acetaminophen 325 MG tablet Commonly known as: Tylenol Take 2 tablets (650 mg total) by mouth every 4 (four) hours as needed (for pain scale < 4).   benzocaine-Menthol 20-0.5 % Aero Commonly known as: DERMOPLAST Apply 1 application topically as needed for irritation (perineal discomfort).   coconut oil Oil Apply 1 application topically as needed.   ibuprofen 600 MG tablet Commonly known as: ADVIL Take 1 tablet (600 mg total) by mouth every 6 (six) hours.   iron polysaccharides 150 MG capsule Commonly known as: Ferrex 150 Take 1 capsule (150 mg total) by mouth daily.    Magnesium Oxide 400 (240 Mg) MG Tabs Take 1 tablet (400 mg total) by mouth daily. For prevention of constipation.            Discharge Care Instructions  (From admission, onward)         Start     Ordered   07/26/20 0000  Discharge wound care:       Comments: Sitz baths 2 times /day with warm water x 1 week. May add herbals: 1 ounce dried comfrey leaf* 1 ounce calendula flowers 1 ounce lavender flowers  Supplies can be found online at [employer] sources at [employer], [employer]  1/2 ounce dried uva ursi leaves 1/2 ounce witch hazel blossoms (if you can find them) 1/2 ounce dried sage leaf 1/2 cup sea salt Directions: Bring 2 quarts of water to a boil. Turn off heat, and place 1 ounce (approximately 1 large handful) of the above mixed herbs (not the salt) into the pot. Steep, covered, for 30 minutes.  Strain the liquid well with a fine mesh strainer, and discard the herb material. Add 2 quarts of liquid to the tub, along with the 1/2 cup of salt. This medicinal liquid can also be made into compresses and peri-rinses.   07/26/20 1045          Diet: routine diet  Activity: Advance as tolerated. Pelvic rest for 6 weeks.   Postpartum contraception: Not Discussed  Newborn Data: Live born female  Birth Weight: 8 lb 14.3 oz (4034 g) APGAR: 8, 9  Newborn Delivery   Birth date/time: 07/24/2020 11:15:00 Delivery type: Vaginal, Spontaneous      named Su Hilt  Baby Feeding: Breast Disposition:home with mother   Delivery Report:  Review the Delivery Report for details.    Follow up:  Follow-up Information    Olivia Mackie, MD. Schedule an appointment as soon as possible for a visit in 6 week(s).   Specialty: Obstetrics and Gynecology Contact information: [address] [place] [address] [phone]                 Signed: Cipriano Mile, MSN 07/26/2020, 10:46 AM

## 2020-07-26 NOTE — Plan of Care (Signed)
Patient appropriate for discharge.

## 2020-07-26 NOTE — Lactation Note (Signed)
This note was copied from a baby's chart. Lactation Consultation Note  Patient Name: Alyssa Montes UEAVW'U Date: 07/26/2020 Reason for consult: Follow-up assessment Type of Endocrine Disorder?: PCOS   Mother is a P64, infant is 57  hours old and is now at 9 % wt loss.  Assist mother to chair for breast feeding support.   Mother has bilateral nipple blisters. Suggested that mother have OB to call in [facility] for her nipples. Mother was given comfort gels.    She reports that she has nipple pain and that she has been using the same positions over and over.  Assist mother with good pillow support and good alignment . Infant latched on well.mother continued to complain of pain scale of 1-2 while infant was suckling infants lips were widely flanged.  Mother taught to do good breast compression. Observed infant with audible swallows and rhythmic suckling. . Infant consistently suckled at the breast for 10-15 mins. Mother to offer the alternate breast .   Discussed cluster feeding and treatment and prevention of engorgement . Marland Kitchen   Mother to continue to cue base feed infant and feed at least 8-12 times or more in 24 hours and advised to allow for cluster feeding infant as needed.   Mother to continue to due STS. Mother is aware of available LC services at [facility], BFSG'S, OP Dept, and phone # for questions or concerns about breastfeeding.  Mother receptive to all teaching and plan of care.     Maternal Data    Feeding Feeding Type: Breast Fed  LATCH Score Latch: Grasps breast easily, tongue down, lips flanged, rhythmical sucking.  Audible Swallowing: Spontaneous and intermittent  Type of Nipple: Everted at rest and after stimulation  Comfort (Breast/Nipple): Filling, red/small blisters or bruises, mild/mod discomfort  Hold (Positioning): Assistance needed to correctly position infant at breast and maintain latch.  LATCH Score: 8  Interventions Interventions: Assisted with  latch;Skin to skin;Hand express;Breast compression;Adjust position;Support pillows;Position options;Comfort gels  Lactation Tools Discussed/Used     Consult Status Consult Status: Complete    Michel Bickers 07/26/2020, 9:47 AM

## 2020-07-26 NOTE — Discharge Instructions (Signed)
Lactation outpatient support - home visit ° ° °Linda Coppola °RN, MHA, IBCLC °at [facility]: Lactation Consultant ° °[email or web address] ° °

## 2020-09-05 DIAGNOSIS — Z124 Encounter for screening for malignant neoplasm of cervix: Secondary | ICD-10-CM | POA: Diagnosis not present

## 2020-09-05 DIAGNOSIS — Z1151 Encounter for screening for human papillomavirus (HPV): Secondary | ICD-10-CM | POA: Diagnosis not present

## 2020-10-05 DIAGNOSIS — R102 Pelvic and perineal pain: Secondary | ICD-10-CM | POA: Diagnosis not present

## 2020-10-26 DIAGNOSIS — R102 Pelvic and perineal pain: Secondary | ICD-10-CM | POA: Diagnosis not present

## 2020-11-21 DIAGNOSIS — R102 Pelvic and perineal pain: Secondary | ICD-10-CM | POA: Diagnosis not present

## 2021-09-11 LAB — OB RESULTS CONSOLE ABO/RH: RH Type: POSITIVE

## 2021-09-11 LAB — OB RESULTS CONSOLE ANTIBODY SCREEN: Antibody Screen: NEGATIVE

## 2021-10-10 LAB — OB RESULTS CONSOLE RUBELLA ANTIBODY, IGM: Rubella: IMMUNE

## 2021-10-10 LAB — OB RESULTS CONSOLE HIV ANTIBODY (ROUTINE TESTING): HIV: NONREACTIVE

## 2021-10-10 LAB — OB RESULTS CONSOLE RPR: RPR: NONREACTIVE

## 2021-10-10 LAB — OB RESULTS CONSOLE HEPATITIS B SURFACE ANTIGEN: Hepatitis B Surface Ag: NEGATIVE

## 2021-10-31 LAB — OB RESULTS CONSOLE GC/CHLAMYDIA
Chlamydia: NEGATIVE
Gonorrhea: NEGATIVE

## 2021-12-23 NOTE — L&D Delivery Note (Signed)
Delivery Note At 11:32 AM a viable and healthy female was delivered via Vaginal, Spontaneous (Presentation: Left Occiput Anterior).  APGAR: 8, 9; weight 9 lb 1 oz (4110 g).   Placenta status: Spontaneous, Intact.  Cord: 3 vessels with the following complications: None.  Cord pH: na  Anesthesia: Epidural Episiotomy: None Lacerations: 2nd degree Suture Repair: 2.0 vicryl rapide Est. Blood Loss (mL): 100  Mom to postpartum.  Baby to Couplet care / Skin to Skin.  Ahni Bradwell J 05/09/2022, 12:36 PM

## 2022-04-08 LAB — OB RESULTS CONSOLE GBS: GBS: NEGATIVE

## 2022-05-05 ENCOUNTER — Inpatient Hospital Stay (HOSPITAL_COMMUNITY): Admit: 2022-05-05 | Payer: BC Managed Care – PPO

## 2022-05-09 ENCOUNTER — Other Ambulatory Visit: Payer: Self-pay

## 2022-05-09 ENCOUNTER — Inpatient Hospital Stay (HOSPITAL_COMMUNITY)
Admission: AD | Admit: 2022-05-09 | Discharge: 2022-05-10 | DRG: 807 | Disposition: A | Discharge status: Home / Self Care | Payer: BC Managed Care – PPO | Attending: Obstetrics and Gynecology | Admitting: Obstetrics and Gynecology

## 2022-05-09 ENCOUNTER — Encounter (HOSPITAL_COMMUNITY): Payer: Self-pay

## 2022-05-09 ENCOUNTER — Inpatient Hospital Stay (HOSPITAL_COMMUNITY): Payer: BC Managed Care – PPO | Admitting: Anesthesiology

## 2022-05-09 DIAGNOSIS — O26893 Other specified pregnancy related conditions, third trimester: Secondary | ICD-10-CM | POA: Diagnosis present

## 2022-05-09 DIAGNOSIS — O480 Post-term pregnancy: Principal | ICD-10-CM | POA: Diagnosis present

## 2022-05-09 DIAGNOSIS — Z3A40 40 weeks gestation of pregnancy: Secondary | ICD-10-CM

## 2022-05-09 LAB — CBC
HCT: 36.7 % (ref 36.0–46.0)
Hemoglobin: 12.7 g/dL (ref 12.0–15.0)
MCH: 32 pg (ref 26.0–34.0)
MCHC: 34.6 g/dL (ref 30.0–36.0)
MCV: 92.4 fL (ref 80.0–100.0)
Platelets: 170 10*3/uL (ref 150–400)
RBC: 3.97 MIL/uL (ref 3.87–5.11)
RDW: 13.1 % (ref 11.5–15.5)
WBC: 5.8 10*3/uL (ref 4.0–10.5)
nRBC: 0 % (ref 0.0–0.2)

## 2022-05-09 LAB — TYPE AND SCREEN
ABO/RH(D): B POS
Antibody Screen: NEGATIVE

## 2022-05-09 LAB — RPR: RPR Ser Ql: NONREACTIVE

## 2022-05-09 MED ORDER — BENZOCAINE-MENTHOL 20-0.5 % EX AERO
1.0000 "application " | INHALATION_SPRAY | CUTANEOUS | Status: DC | PRN
  Administered 2022-05-09: 1 via TOPICAL
  Filled 2022-05-09: qty 56

## 2022-05-09 MED ORDER — TETANUS-DIPHTH-ACELL PERTUSSIS 5-2.5-18.5 LF-MCG/0.5 IM SUSY: 0.5000 mL | PREFILLED_SYRINGE | Freq: Once | INTRAMUSCULAR | Status: DC

## 2022-05-09 MED ORDER — IBUPROFEN 600 MG PO TABS
600.0000 mg | ORAL_TABLET | Freq: Four times a day (QID) | ORAL | Status: DC
  Administered 2022-05-09 – 2022-05-10 (×4): 600 mg via ORAL
  Filled 2022-05-09 (×4): qty 1

## 2022-05-09 MED ORDER — LACTATED RINGERS IV SOLN: 500.0000 mL | INTRAVENOUS | Status: DC | PRN

## 2022-05-09 MED ORDER — FAMOTIDINE 20 MG PO TABS
20.0000 mg | ORAL_TABLET | Freq: Two times a day (BID) | ORAL | Status: DC
  Administered 2022-05-09: 20 mg via ORAL
  Filled 2022-05-09: qty 1

## 2022-05-09 MED ORDER — EPHEDRINE 5 MG/ML INJ: 10.0000 mg | INTRAVENOUS | Status: DC | PRN

## 2022-05-09 MED ORDER — DIBUCAINE (PERIANAL) 1 % EX OINT
1.0000 | TOPICAL_OINTMENT | CUTANEOUS | Status: DC | PRN
Start: 2022-05-09 — End: 2022-05-10

## 2022-05-09 MED ORDER — OXYCODONE-ACETAMINOPHEN 5-325 MG PO TABS: 2.0000 | ORAL_TABLET | ORAL | Status: DC | PRN

## 2022-05-09 MED ORDER — FENTANYL-BUPIVACAINE-NACL 0.5-0.125-0.9 MG/250ML-% EP SOLN
12.0000 mL/h | EPIDURAL | Status: DC | PRN
  Administered 2022-05-09: 12 mL/h via EPIDURAL
  Filled 2022-05-09: qty 250

## 2022-05-09 MED ORDER — FLEET ENEMA 7-19 GM/118ML RE ENEM: 1.0000 | ENEMA | RECTAL | Status: DC | PRN

## 2022-05-09 MED ORDER — WITCH HAZEL-GLYCERIN EX PADS: 1.0000 "application " | MEDICATED_PAD | CUTANEOUS | Status: DC | PRN

## 2022-05-09 MED ORDER — SIMETHICONE 80 MG PO CHEW: 80.0000 mg | CHEWABLE_TABLET | ORAL | Status: DC | PRN

## 2022-05-09 MED ORDER — ACETAMINOPHEN 325 MG PO TABS
650.0000 mg | ORAL_TABLET | ORAL | Status: DC | PRN
  Administered 2022-05-10 (×2): 650 mg via ORAL
  Filled 2022-05-09 (×2): qty 2

## 2022-05-09 MED ORDER — OXYTOCIN-SODIUM CHLORIDE 30-0.9 UT/500ML-% IV SOLN
2.5000 [IU]/h | INTRAVENOUS | Status: DC
  Filled 2022-05-09: qty 500

## 2022-05-09 MED ORDER — SOD CITRATE-CITRIC ACID 500-334 MG/5ML PO SOLN: 30.0000 mL | ORAL | Status: DC | PRN

## 2022-05-09 MED ORDER — ONDANSETRON HCL 4 MG/2ML IJ SOLN
4.0000 mg | INTRAMUSCULAR | Status: DC | PRN
Start: 2022-05-09 — End: 2022-05-10

## 2022-05-09 MED ORDER — LIDOCAINE HCL (PF) 1 % IJ SOLN: 30.0000 mL | INTRAMUSCULAR | Status: DC | PRN

## 2022-05-09 MED ORDER — PHENYLEPHRINE 80 MCG/ML (10ML) SYRINGE FOR IV PUSH (FOR BLOOD PRESSURE SUPPORT): 80.0000 ug | PREFILLED_SYRINGE | INTRAVENOUS | Status: DC | PRN

## 2022-05-09 MED ORDER — METHYLERGONOVINE MALEATE 0.2 MG/ML IJ SOLN: 0.2000 mg | INTRAMUSCULAR | Status: DC | PRN

## 2022-05-09 MED ORDER — ONDANSETRON HCL 4 MG/2ML IJ SOLN: 4.0000 mg | Freq: Four times a day (QID) | INTRAMUSCULAR | Status: DC | PRN

## 2022-05-09 MED ORDER — CALCIUM CARBONATE ANTACID 500 MG PO CHEW
1.0000 | CHEWABLE_TABLET | Freq: Every day | ORAL | Status: DC | PRN
  Administered 2022-05-09: 200 mg via ORAL
  Filled 2022-05-09: qty 1

## 2022-05-09 MED ORDER — LACTATED RINGERS IV SOLN: 500.0000 mL | Freq: Once | INTRAVENOUS | Status: DC

## 2022-05-09 MED ORDER — ONDANSETRON HCL 4 MG PO TABS: 4.0000 mg | ORAL_TABLET | ORAL | Status: DC | PRN

## 2022-05-09 MED ORDER — OXYTOCIN BOLUS FROM INFUSION
333.0000 mL | Freq: Once | INTRAVENOUS | Status: AC
  Administered 2022-05-09: 333 mL via INTRAVENOUS

## 2022-05-09 MED ORDER — METHYLERGONOVINE MALEATE 0.2 MG PO TABS: 0.2000 mg | ORAL_TABLET | ORAL | Status: DC | PRN

## 2022-05-09 MED ORDER — DIPHENHYDRAMINE HCL 25 MG PO CAPS: 25.0000 mg | ORAL_CAPSULE | Freq: Four times a day (QID) | ORAL | Status: DC | PRN

## 2022-05-09 MED ORDER — SENNOSIDES-DOCUSATE SODIUM 8.6-50 MG PO TABS
2.0000 | ORAL_TABLET | Freq: Every day | ORAL | Status: DC
  Administered 2022-05-10: 2 via ORAL
  Filled 2022-05-09: qty 2

## 2022-05-09 MED ORDER — COCONUT OIL OIL
1.0000 "application " | TOPICAL_OIL | Status: DC | PRN
  Administered 2022-05-10: 1 via TOPICAL

## 2022-05-09 MED ORDER — OXYCODONE-ACETAMINOPHEN 5-325 MG PO TABS: 1.0000 | ORAL_TABLET | ORAL | Status: DC | PRN

## 2022-05-09 MED ORDER — LACTATED RINGERS IV SOLN: INTRAVENOUS | Status: DC

## 2022-05-09 MED ORDER — PRENATAL MULTIVITAMIN CH
1.0000 | ORAL_TABLET | Freq: Every day | ORAL | Status: DC
  Administered 2022-05-10: 1 via ORAL
  Filled 2022-05-09: qty 1

## 2022-05-09 MED ORDER — DIPHENHYDRAMINE HCL 50 MG/ML IJ SOLN: 12.5000 mg | INTRAMUSCULAR | Status: DC | PRN

## 2022-05-09 MED ORDER — ACETAMINOPHEN 325 MG PO TABS: 650.0000 mg | ORAL_TABLET | ORAL | Status: DC | PRN

## 2022-05-09 MED ORDER — LIDOCAINE HCL (PF) 1 % IJ SOLN
INTRAMUSCULAR | Status: DC | PRN
  Administered 2022-05-09: 11 mL via EPIDURAL

## 2022-05-09 MED ORDER — ZOLPIDEM TARTRATE 5 MG PO TABS: 5.0000 mg | ORAL_TABLET | Freq: Every evening | ORAL | Status: DC | PRN

## 2022-05-09 NOTE — Anesthesia Preprocedure Evaluation (Signed)
Anesthesia Evaluation  Patient identified by MRN, date of birth, ID band Patient awake    Reviewed: Allergy & Precautions, Patient's Chart, lab work & pertinent test results  Airway Mallampati: II  TM Distance: >3 FB Neck ROM: Full    Dental no notable dental hx.    Pulmonary neg pulmonary ROS,    Pulmonary exam normal breath sounds clear to auscultation       Cardiovascular negative cardio ROS Normal cardiovascular exam Rhythm:Regular Rate:Normal     Neuro/Psych negative neurological ROS  negative psych ROS   GI/Hepatic negative GI ROS, Neg liver ROS,   Endo/Other  negative endocrine ROS  Renal/GU negative Renal ROS   PCOS, dysmenorrhea    Musculoskeletal negative musculoskeletal ROS (+)   Abdominal   Peds negative pediatric ROS (+)  Hematology negative hematology ROS (+) hct 38.8, plt 185   Anesthesia Other Findings   Reproductive/Obstetrics (+) Pregnancy G1P0                             Anesthesia Physical  Anesthesia Plan  ASA: II  Anesthesia Plan: Epidural   Post-op Pain Management:    Induction:   PONV Risk Score and Plan: 2  Airway Management Planned: Natural Airway  Additional Equipment: None  Intra-op Plan:   Post-operative Plan:   Informed Consent: I have reviewed the patients History and Physical, chart, labs and discussed the procedure including the risks, benefits and alternatives for the proposed anesthesia with the patient or authorized representative who has indicated his/her understanding and acceptance.       Plan Discussed with:   Anesthesia Plan Comments:         Anesthesia Quick Evaluation

## 2022-05-09 NOTE — H&P (Addendum)
Alyssa Montes is a 36 y.o. female presenting for labor. 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 Social History:  reports that she has never smoked. She has never used smokeless tobacco. She reports that she does not currently use alcohol. She reports that she does not use drugs.     Maternal Diabetes: No Genetic Screening: Normal Maternal Ultrasounds/Referrals: Normal Fetal Ultrasounds or other Referrals:  None Maternal Substance Abuse:  No Significant Maternal Medications:  None Significant Maternal Lab Results:  Group B Strep negative Other Comments:  None  Review of Systems  Constitutional: Negative.   All other systems reviewed and are negative. Maternal Medical History:  Reason for admission: Contractions.   Contractions: Onset was 3-5 hours ago.   Frequency: regular.   Perceived severity is moderate.   Fetal activity: Perceived fetal activity is normal.   Last perceived fetal movement was within the past hour.   Prenatal complications: no prenatal complications Prenatal Complications - Diabetes: none.  Dilation: 4 Effacement (%): 90 Station: -1 Exam by:: Agustin Cree, RN Blood pressure 111/76, pulse 69, temperature 97.7 F (36.5 C), temperature source Oral, resp. rate 18, height 5\' 5"  (1.651 m), weight 77.5 kg, SpO2 100 %, unknown if currently breastfeeding. Maternal Exam:  Introitus: Normal vulva.   Physical Exam Constitutional:      Appearance: Normal appearance. She is normal weight.  HENT:     Head: Normocephalic and atraumatic.  Cardiovascular:     Rate and Rhythm: Normal rate and regular rhythm.  Pulmonary:     Effort: Pulmonary effort is normal.     Breath sounds: Normal breath sounds.  Genitourinary:    General: Normal vulva.  Musculoskeletal:        General: Normal range of motion.     Cervical back: Normal range of motion and neck supple.  Skin:    General: Skin is warm.  Neurological:     General: No focal deficit present.     Mental Status: She is alert.  Psychiatric:        Mood and Affect: Mood normal.    Prenatal labs: ABO, Rh: --/--/B POS (05/18 0815) Antibody: NEG (05/18 0815) Rubella: Immune (10/19 0000) RPR: Nonreactive (10/19 0000)  HBsAg: Negative (10/19 0000)  HIV: Non-reactive (10/19 0000)  GBS: Negative/-- (04/17 0000)   Assessment/Plan: Term IUP in labor Epidural prn Augment prn   Coy Rochford J 05/09/2022, 9:51 AM

## 2022-05-09 NOTE — Anesthesia Postprocedure Evaluation (Signed)
Anesthesia Post Note  Patient: Alyssa Montes  Procedure(s) Performed: AN AD HOC LABOR EPIDURAL     Patient location during evaluation: Mother Baby Anesthesia Type: Epidural Level of consciousness: awake and alert Pain management: pain level controlled Vital Signs Assessment: post-procedure vital signs reviewed and stable Respiratory status: spontaneous breathing, nonlabored ventilation and respiratory function stable Cardiovascular status: stable Postop Assessment: no headache, no backache and epidural receding Anesthetic complications: no   No notable events documented.  Last Vitals:  Vitals:   05/09/22 1408 05/09/22 1741  BP: 128/78 132/80  Pulse: 67 63  Resp: 17 16  Temp: 36.9 C 36.8 C  SpO2:      Last Pain:  Vitals:   05/09/22 1741  TempSrc: Oral  PainSc:    Pain Goal:                   Alyssa Montes

## 2022-05-09 NOTE — Lactation Note (Signed)
This note was copied from a baby's chart. Lactation Consultation Note  Patient Name: Alyssa Montes [id number] Date: 05/09/2022 Reason for consult: L&D Initial assessment Age:35 hours  P2, Baby was latched when [facility] entered room on 2nd breast.  Mother is experienced and noted swallows. Lactation to follow up on MBU.   Maternal Data Does the patient have breastfeeding experience prior to this delivery?: Yes How long did the patient breastfeed?: 8 mos.  Feeding Mother's Current Feeding Choice: Breast Milk     Interventions Interventions: Education  Consult Status Consult Status: Follow-up from L&D    Dahlia Byes [facility] 05/09/2022, 12:33 PM

## 2022-05-09 NOTE — Anesthesia Procedure Notes (Signed)
Epidural Patient location during procedure: OB Start time: 05/09/2022 9:00 AM End time: 05/09/2022 9:22 AM  Staffing Anesthesiologist: Lowella Curb, MD Performed: anesthesiologist   Preanesthetic Checklist Completed: patient identified, IV checked, site marked, risks and benefits discussed, surgical consent, monitors and equipment checked, pre-op evaluation and timeout performed  Epidural Patient position: sitting Prep: ChloraPrep Patient monitoring: heart rate, cardiac monitor, continuous pulse ox and blood pressure Approach: midline Location: L2-L3 Injection technique: LOR saline  Needle:  Needle type: Tuohy  Needle gauge: 17 G Needle length: 9 cm Needle insertion depth: 4 cm Catheter type: closed end flexible Catheter size: 20 Guage Catheter at skin depth: 8 cm Test dose: negative  Assessment Events: blood not aspirated, injection not painful, no injection resistance, no paresthesia and negative IV test  Additional Notes Reason for block:procedure for pain

## 2022-05-09 NOTE — MAU Note (Signed)
...  Alyssa Montes is a 36 y.o. at [redacted]w[redacted]d here in MAU reporting: CTX asince 0330 this morning that are currently every 4 minutes. She denies LOF or VB. +FM. GBS-.  260-1 in the office on the 15th.   Pain score: 7/10 lower abdomen  FHT: 133 initial external Lab orders placed from triage:  MAU Labor Eval

## 2022-05-10 LAB — CBC
HCT: 31.8 % — ABNORMAL LOW (ref 36.0–46.0)
Hemoglobin: 10.9 g/dL — ABNORMAL LOW (ref 12.0–15.0)
MCH: 32.2 pg (ref 26.0–34.0)
MCHC: 34.3 g/dL (ref 30.0–36.0)
MCV: 93.8 fL (ref 80.0–100.0)
Platelets: 142 10*3/uL — ABNORMAL LOW (ref 150–400)
RBC: 3.39 MIL/uL — ABNORMAL LOW (ref 3.87–5.11)
RDW: 13.4 % (ref 11.5–15.5)
WBC: 5 10*3/uL (ref 4.0–10.5)
nRBC: 0 % (ref 0.0–0.2)

## 2022-05-10 NOTE — Lactation Note (Signed)
This note was copied from a baby's chart. Lactation Consultation Note RN stated mom doesn't want to see Lactation tonight but would like to see them in the morning.  Patient Name: Alyssa Montes [id number] Date: 05/10/2022   Age:35 hours  Maternal Data    Feeding    LATCH Score                    Lactation Tools Discussed/Used    Interventions Interventions: Skin to skin;Hand express;Expressed milk  Discharge    Consult Status      Charyl Dancer 05/10/2022, 12:05 AM

## 2022-05-10 NOTE — Progress Notes (Signed)
Post Partum Day 1 Subjective: no complaints, up ad lib, voiding, tolerating PO, and + flatus  Objective: Blood pressure 113/77, pulse (!) 58, temperature 98 F (36.7 C), temperature source Oral, resp. rate 18, height 5\' 5"  (1.651 m), weight 77.5 kg, SpO2 100 %, unknown if currently breastfeeding.  Physical Exam:  General: alert, cooperative, and appears stated age Lochia: appropriate Uterine Fundus: firm Incision: healing well DVT Evaluation: No evidence of DVT seen on physical exam.  Recent Labs    05/09/22 0811 05/10/22 0500  HGB 12.7 10.9*  HCT 36.7 31.8*    Assessment/Plan: Stable PPD 1 NSAIDs for pain management Discharge home and Breastfeeding   LOS: 1 day   Kimiko Common J 05/10/2022, 10:56 AM

## 2022-05-10 NOTE — Discharge Summary (Signed)
OB Discharge Summary  Patient Name: Alyssa Montes DOB: 11-19-1986 MRN: [id number]  Date of admission: 05/09/2022 Delivering provider: Olivia Mackie   Admitting diagnosis: Normal labor and delivery [O80] Intrauterine pregnancy: [redacted]w[redacted]d     Secondary diagnosis: Patient Active Problem List   Diagnosis Date Noted   Normal labor and delivery 05/09/2022   Acute blood loss anemia 07/25/2020   SVD (spontaneous vaginal delivery) 8/2 07/24/2020   Second degree perineal laceration 07/24/2020   Postpartum care following vaginal delivery 8/2 07/24/2020   Encounter for induction of labor 07/24/2020   Normal labor 07/23/2020   Additional problems:na   Date of discharge: 05/10/2022   Discharge diagnosis: Principal Problem:   Normal labor and delivery                                                              Post partum procedures: na  Augmentation: AROM Pain control: Epidural  Laceration:2nd degree  Episiotomy:None  Complications: none  Hospital course:  Onset of Labor With Vaginal Delivery      36 y.o. yo [id number] at [redacted]w[redacted]d was admitted in Active Labor on 05/09/2022. Patient had an uncomplicated labor course as follows:  Membrane Rupture Time/Date: 10:05 AM ,05/09/2022   Delivery Method:Vaginal, Spontaneous  Episiotomy: None  Lacerations:  2nd degree  Patient had an uncomplicated postpartum course.  She is ambulating, tolerating a regular diet, passing flatus, and urinating well. Patient is discharged home in stable condition on 05/10/22.  Newborn Data: Birth date:05/09/2022  Birth time:11:32 AM  Gender:Female  Living status:Living  Apgars:8 ,9  Weight:4110 g   Physical exam  Vitals:   05/09/22 1408 05/09/22 1741 05/09/22 2150 05/10/22 0154  BP: 128/78 132/80 113/70 113/77  Pulse: 67 63 75 (!) 58  Resp: 17 16 18 18   Temp: 98.4 F (36.9 C) 98.3 F (36.8 C) 98.2 F (36.8 C) 98 F (36.7 C)  TempSrc: Oral Oral Oral Oral  SpO2:   99% 100%  Weight:      Height:        General: alert, cooperative, and no distress Lochia: appropriate Uterine Fundus: firm Incision: Healing well with no significant drainage Perineum: repair intact, tr edema DVT Evaluation: No cords or calf tenderness. Labs: Lab Results  Component Value Date   WBC 5.0 05/10/2022   HGB 10.9 (L) 05/10/2022   HCT 31.8 (L) 05/10/2022   MCV 93.8 05/10/2022   PLT 142 (L) 05/10/2022      Latest Ref Rng & Units 10/29/2013   12:00 AM  CMP  BUN 4 - 21 mg/dL 13       Creatinine 0.5 - 1.1 mg/dL 0.8       Sodium 13/06/2013 - 147 mmol/L 139       Potassium 3.4 - 5.3 mmol/L 4.3       Alkaline Phos 25 - 125 U/L 60       AST 13 - 35 U/L 24       ALT 7 - 35 U/L 25          This result is from an external source.       View : No data to display.         Vaccines: TDaP          na  COVID-19   na        Flu             na  Discharge instruction:  per After Visit Summary,  [facility] booklet and  "Understanding Mother [facility] Baby Care" hospital booklet  After Visit Meds:  Allergies as of 05/10/2022   No Known Allergies      Medication List     TAKE these medications    acetaminophen 325 MG tablet Commonly known as: Tylenol Take 2 tablets (650 mg total) by mouth every 4 (four) hours as needed (for pain scale < 4).   calcium carbonate 750 MG chewable tablet Commonly known as: TUMS EX Chew 3,000 mg by mouth daily as needed for heartburn.   iron polysaccharides 150 MG capsule Commonly known as: Ferrex 150 Take 1 capsule (150 mg total) by mouth daily.   magnesium oxide 400 (240 Mg) MG tablet Commonly known as: MAG-OX Take 1 tablet (400 mg total) by mouth daily. For prevention of constipation.   Pepcid AC Maximum Strength 20 MG tablet Generic drug: famotidine Take 20 mg by mouth daily as needed for heartburn.   PRENATAL PO Take 1 tablet by mouth daily.   Vitamin D-3 25 MCG (1000 UT) Caps Take 1,000 Units by mouth daily.        Diet: routine diet  Activity:  Advance as tolerated. Pelvic rest for 6 weeks.   Postpartum contraception: TBA in office  Newborn Data: Live born female  Birth Weight: 9 lb 1 oz (4110 g) APGAR: 8, 9  Newborn Delivery   Birth date/time: 05/09/2022 11:32:00 Delivery type: Vaginal, Spontaneous      named na Baby Feeding: Breast Disposition:home with mother Circumcision: na   Delivery Report:  Review the Delivery Report for details.    Follow up:  WOB 6w   Signed: Lenoard Aden, CNM, MSN 05/10/2022, 10:59 AM

## 2022-05-10 NOTE — Lactation Note (Signed)
This note was copied from a baby's chart. Lactation Consultation Note  Patient Name: Alyssa Montes [id number] Date: 05/10/2022 Reason for consult: Follow-up assessment;Term Age:35 hours   P2 mother whose infant is now 87 hours old.  This is a term baby at 40+4 weeks.  Mother breast fed her first child (now 58 months) for 10 months.  Her current feeding preference is breast/formula.  Baby "[name]" was crying when I arrived.  Offered to assist with latching and mother receptive.  Reviewed breast feeding basics and assisted "[name]" to latch easily.  She had a wide gape and flanged lips.  Mother denied pain; cramping noted.  Called RN for Tylenol.    Mother will continue to feed on cue and will call for assistance as needed.  Family hoping for a discharge today.     Maternal Data Has patient been taught Hand Expression?: Yes Does the patient have breastfeeding experience prior to this delivery?: Yes How long did the patient breastfeed?: 10 months  Feeding Mother's Current Feeding Choice: Breast Milk and Formula  LATCH Score Latch: Grasps breast easily, tongue down, lips flanged, rhythmical sucking.  Audible Swallowing: A few with stimulation  Type of Nipple: Everted at rest and after stimulation  Comfort (Breast/Nipple): Soft / non-tender  Hold (Positioning): Assistance needed to correctly position infant at breast and maintain latch.  LATCH Score: 8   Lactation Tools Discussed/Used    Interventions Interventions: Breast feeding basics reviewed;Assisted with latch;Skin to skin;Breast massage;Hand express;Breast compression;Position options;Support pillows;Adjust position;Education  Discharge Discharge Education: Engorgement and breast care Pump: Personal  Consult Status Consult Status: Complete    Mattthew Ziomek R Marzell Allemand 05/10/2022, 10:19 AM

## 2022-05-13 LAB — BIRTH TISSUE RECOVERY COLLECTION (PLACENTA DONATION)

## 2022-05-18 ENCOUNTER — Telehealth (HOSPITAL_COMMUNITY): Payer: Self-pay

## 2022-05-18 NOTE — Telephone Encounter (Signed)
No answer. Left message to return nurse call.  Sharyn Lull [facility] 05/27//2023,1030

## 2022-11-21 DIAGNOSIS — D1722 Benign lipomatous neoplasm of skin and subcutaneous tissue of left arm: Secondary | ICD-10-CM | POA: Diagnosis not present

## 2022-11-21 DIAGNOSIS — D225 Melanocytic nevi of trunk: Secondary | ICD-10-CM | POA: Diagnosis not present

## 2022-11-21 DIAGNOSIS — L308 Other specified dermatitis: Secondary | ICD-10-CM | POA: Diagnosis not present

## 2022-11-21 DIAGNOSIS — D2262 Melanocytic nevi of left upper limb, including shoulder: Secondary | ICD-10-CM | POA: Diagnosis not present

## 2023-01-06 LAB — OB RESULTS CONSOLE GC/CHLAMYDIA
Chlamydia: NEGATIVE
Neisseria Gonorrhea: NEGATIVE

## 2023-10-30 DIAGNOSIS — Z1331 Encounter for screening for depression: Secondary | ICD-10-CM | POA: Diagnosis not present

## 2023-10-30 DIAGNOSIS — Z01419 Encounter for gynecological examination (general) (routine) without abnormal findings: Secondary | ICD-10-CM | POA: Diagnosis not present

## 2023-10-30 DIAGNOSIS — Z124 Encounter for screening for malignant neoplasm of cervix: Secondary | ICD-10-CM | POA: Diagnosis not present

## 2023-10-30 DIAGNOSIS — Z01411 Encounter for gynecological examination (general) (routine) with abnormal findings: Secondary | ICD-10-CM | POA: Diagnosis not present

## 2023-11-19 DIAGNOSIS — Z3201 Encounter for pregnancy test, result positive: Secondary | ICD-10-CM | POA: Diagnosis not present

## 2023-12-01 DIAGNOSIS — F4322 Adjustment disorder with anxiety: Secondary | ICD-10-CM | POA: Diagnosis not present

## 2023-12-01 DIAGNOSIS — F411 Generalized anxiety disorder: Secondary | ICD-10-CM | POA: Diagnosis not present

## 2023-12-04 DIAGNOSIS — O368990 Maternal care for other specified fetal problems, unspecified trimester, not applicable or unspecified: Secondary | ICD-10-CM | POA: Diagnosis not present

## 2023-12-12 DIAGNOSIS — F4322 Adjustment disorder with anxiety: Secondary | ICD-10-CM | POA: Diagnosis not present

## 2023-12-18 DIAGNOSIS — Z3689 Encounter for other specified antenatal screening: Secondary | ICD-10-CM | POA: Diagnosis not present

## 2023-12-18 DIAGNOSIS — O09521 Supervision of elderly multigravida, first trimester: Secondary | ICD-10-CM | POA: Diagnosis not present

## 2023-12-18 DIAGNOSIS — Z3A10 10 weeks gestation of pregnancy: Secondary | ICD-10-CM | POA: Diagnosis not present

## 2023-12-18 LAB — OB RESULTS CONSOLE HIV ANTIBODY (ROUTINE TESTING): HIV: NONREACTIVE

## 2023-12-18 LAB — OB RESULTS CONSOLE HEPATITIS B SURFACE ANTIGEN: Hepatitis B Surface Ag: NEGATIVE

## 2023-12-18 LAB — OB RESULTS CONSOLE RUBELLA ANTIBODY, IGM: Rubella: IMMUNE

## 2023-12-24 NOTE — L&D Delivery Note (Signed)
   Delivery Note:   [id number] at [redacted]w[redacted]d  Admitting diagnosis: Indication for care in labor and delivery, antepartum [O75.9] Risks: A1GDM; anemia Onset of labor: 07/07/2024 at 0140 IOL/Augmentation: N/A ROM: 07/07/2024 at 0140, clear fluid  Complete dilation at 07/07/2024 0518 Onset of pushing at 0525 FHR second stage Cat I  Analgesia/Anesthesia intrapartum:Epidural Pushing in lithotomy position with CNM and L&D staff support. Husband, [name], present for birth and supportive.  Delivery of a Live born female  Birth Weight:  pending APGAR: 8, 9  Newborn Delivery   Birth date/time: 07/07/2024 05:29:32 Delivery type: Vaginal, Spontaneous    in cephalic presentation, position OA to LOA.  APGAR:1 min-8 , 5 min-9   Nuchal Cord: No  Cord double clamped after cessation of pulsation, cut by [name].  Collection of cord blood for typing completed. Arterial cord blood sample-No   Placenta delivered-Spontaneous with 3 vessels. Uterotonics: None Placenta to L&D Uterine tone firm  Bleeding scant  Vaginal;Labial laceration identified.  Episiotomy:None Local analgesia: None  Repair: 3-0 in usual fashion Est. Blood Loss (mL):215.00  Complications: None  Mom to postpartum. Baby [name] to Couplet care / Skin to Skin.  Delivery Report:   Review the Delivery Report for details.    Alan MARLA Molt, CNM, MSN 07/07/2024, 5:47 AM

## 2023-12-27 DIAGNOSIS — J0100 Acute maxillary sinusitis, unspecified: Secondary | ICD-10-CM | POA: Diagnosis not present

## 2024-01-07 DIAGNOSIS — Z118 Encounter for screening for other infectious and parasitic diseases: Secondary | ICD-10-CM | POA: Diagnosis not present

## 2024-01-15 ENCOUNTER — Other Ambulatory Visit: Payer: Self-pay

## 2024-01-15 ENCOUNTER — Ambulatory Visit: Payer: BC Managed Care – PPO | Attending: Obstetrics and Gynecology | Admitting: Physical Therapy

## 2024-01-15 ENCOUNTER — Encounter: Payer: Self-pay | Admitting: Physical Therapy

## 2024-01-15 DIAGNOSIS — N393 Stress incontinence (female) (male): Secondary | ICD-10-CM | POA: Insufficient documentation

## 2024-01-15 DIAGNOSIS — M6281 Muscle weakness (generalized): Secondary | ICD-10-CM | POA: Diagnosis not present

## 2024-01-15 DIAGNOSIS — R279 Unspecified lack of coordination: Secondary | ICD-10-CM | POA: Insufficient documentation

## 2024-01-15 DIAGNOSIS — R293 Abnormal posture: Secondary | ICD-10-CM | POA: Diagnosis not present

## 2024-01-15 NOTE — Therapy (Signed)
OUTPATIENT PHYSICAL THERAPY FEMALE PELVIC EVALUATION   Patient Name: Alyssa Montes MRN: [id number] DOB:1986-10-16, 38 y.o., female Today's Date: 01/15/2024  END OF SESSION:  PT End of Session - 01/15/24 1439     Visit Number 1    Date for PT Re-Evaluation 04/14/24    Authorization Type [employer]    PT Start Time 1400    PT Stop Time 1439    PT Time Calculation (min) 39 min    Activity Tolerance Patient tolerated treatment well    Behavior During Therapy [facility] for tasks assessed/performed             Past Medical History:  Diagnosis Date   Dysmenorrhea    PCOS (polycystic ovarian syndrome)    Past Surgical History:  Procedure Laterality Date   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   FOOT SURGERY Right 2008   Patient Active Problem List   Diagnosis Date Noted   SVD 5/18 05/10/2022   Postpartum care following vaginal delivery 5/18 05/10/2022   Second degree perineal laceration 05/10/2022   Normal labor and delivery 05/09/2022    PCP: Duncan Dull, MD  REFERRING PROVIDER: Olivia Mackie, MD  REFERRING DIAG: N81.89 (ICD-10-CM) - Weakening of pelvic fundus  THERAPY DIAG:  Muscle weakness (generalized)  Abnormal posture  Unspecified lack of coordination  Stress incontinence  Rationale for Evaluation and Treatment: Rehabilitation  ONSET DATE: Early 2024  SUBJECTIVE:                                                                                                                                                                                           SUBJECTIVE STATEMENT: Has had two children, now pregnant with third. Feels pressure during period prior to pregnancy now feeling it more with pregnancy progressing.    PAIN:  Are you having pain? No   PRECAUTIONS: Other: [redacted] weeks pregnant  RED FLAGS: None   WEIGHT BEARING RESTRICTIONS: No  FALLS:  Has patient fallen in last 6 months? No  LIVING ENVIRONMENT: Lives with: lives with their  family Lives in: House/apartment   OCCUPATION: marketing  PLOF: Independent  PATIENT GOALS: to be stronger at pelvic floor   PERTINENT HISTORY:  X2 vaginal births (3,1) and tearing (second degree with first) Sexual abuse: No  BOWEL MOVEMENT: Pain with bowel movement: No Type of bowel movement:Type (Bristol Stool Scale) 4, Frequency daily, and Strain No Fully empty rectum: Yes:   Leakage: No Pads: No Fiber supplement: No  URINATION: Pain with urination: No Fully empty bladder: Yes:   Stream: Strong Urgency: No Frequency: not quicker than every 2 hours, nightly  Leakage:  Coughing and clearing her throat Pads: No  INTERCOURSE: Pain with intercourse:  no pain Ability to have vaginal penetration:  No Climax: not painful Marinoff Scale: 0/3  PREGNANCY: Vaginal deliveries 2 Tearing Yes: with first had grade 2 and long labor; did have tearing with second but not as bad.  C-section deliveries 0 Currently pregnant No  PROLAPSE: Yes with periods, with pregnancy a little more noticeable intermittently after being on feet awhile, carrying kids (3,1)   OBJECTIVE:  Note: Objective measures were completed at Evaluation unless otherwise noted.  DIAGNOSTIC FINDINGS:    COGNITION: Overall cognitive status: Within functional limits for tasks assessed     SENSATION: Light touch: Appears intact Proprioception: Appears intact  MUSCLE LENGTH: Bil hamstrings and adductors limited by 25%  LUMBAR SPECIAL TESTS:  Single leg stance test: hip drop noted with bil single leg stance after 3-5s each  FUNCTIONAL TESTS:  Functional squat - bil knee valgus, decreased descent by 25%  GAIT: WFL  POSTURE: rounded shoulders, forward head, and anterior pelvic tilt  PELVIC ALIGNMENT: WFL  LUMBARAROM/PROM:  A/PROM A/PROM  eval  Flexion WFL  Extension WFL  Right lateral flexion WFL  Left lateral flexion WFL  Right rotation Limited by 25%  Left rotation Limited by 25%   (Blank  rows = not tested)  LOWER EXTREMITY ROM:  WFL  LOWER EXTREMITY MMT:  Bil hips grossly 4/5; knees 5/5  PALPATION:   General  no TTP, mild tension at bil lumbar paraspinals and gluteals                External Perineal Exam pt deferred                              Internal Pelvic Floor pt deferred   Patient confirms identification and approves PT to assess internal pelvic floor and treatment No  PELVIC MMT:   MMT eval  Vaginal   Internal Anal Sphincter   External Anal Sphincter   Puborectalis   Diastasis Recti   (Blank rows = not tested)        TONE: Deferred   PROLAPSE: Deferred   TODAY'S TREATMENT:                                                                                                                              DATE:   01/15/24 EVAL Examination completed, findings reviewed, pt educated on POC, HEP, and pressure relief positions. Pt motivated to participate in PT and agreeable to attempt recommendations.    Pt educated on pregnancy precautions including but not limited to vaginal bleeding, contractions, leaking of fluid and change in fetal movement. If any of these occur halt activity and please consult OBGYN for recommended care.     PATIENT EDUCATION:  Education details: [id number] Person educated: Patient Education method: Explanation, Demonstration, Tactile cues, Verbal cues, and Handouts Education comprehension: verbalized understanding, returned demonstration, verbal  cues required, tactile cues required, and needs further education  HOME EXERCISE PROGRAM: [id number], pressure relief positions   ASSESSMENT:  CLINICAL IMPRESSION: Patient is a 38 y.o. female  who was seen today for physical therapy evaluation and treatment for pressure at vaginal opening prior to pregnancy, now pregnant with third baby and feels it a little more often. Pt reports worsening pressure at vaginal area with lifting kids, on feet all day and in the evenings. Pt also has urinary  incontinence with stressors. Pt found to have tension at lumbar spine paraspinals and glutes, decreased hips and core strength. Pt declined internal pelvic floor assessment. Pt educated on HEP and pressure management. Pt denied additional questions. Pt would benefit from additional PT to further address deficits.    OBJECTIVE IMPAIRMENTS: decreased activity tolerance, decreased coordination, decreased endurance, decreased mobility, decreased strength, increased fascial restrictions, impaired flexibility, improper body mechanics, and postural dysfunction.   ACTIVITY LIMITATIONS: continence, locomotion level, and caring for others  PARTICIPATION LIMITATIONS: community activity  PERSONAL FACTORS: Time since onset of injury/illness/exacerbation and 1 comorbidity: x2 vaginal births and currently pregnant  are also affecting patient's functional outcome.   REHAB POTENTIAL: Good  CLINICAL DECISION MAKING: Stable/uncomplicated  EVALUATION COMPLEXITY: Low   GOALS: Goals reviewed with patient? Yes  SHORT TERM GOALS: Target date: 02/12/24  Pt to be I with HEP.  Baseline: Goal status: INITIAL  2.  Pt to be I with pressure management for decreased strain at pelvic floor with lifting toddlers.  Baseline:  Goal status: INITIAL  3.  Pt to be I with knack for decreased urinary incontinence with stressors such as coughing and lifting.  Baseline:  Goal status: INITIAL   LONG TERM GOALS: Target date: 04/14/24  Pt to be I with advanced HEP.  Baseline:  Goal status: INITIAL  2.  Pt to demonstrate at least 5/5 bil hip strength for improved pelvic stability and functional squats without leakage and to prepare for physical demands postpartum. .  Baseline:  Goal status: INITIAL  3.  Pt to demonstrate improved coordination of pelvic floor and breathing mechanics with 10# squat with appropriate synergistic patterns to decrease pain and leakage at least 75% of the time.    Baseline:  Goal status:  INITIAL  4.  Pt to report no more than 1 instance of urinary incontinence in a day due to improved support from pelvic floor to decreased leakage.  Baseline:  Goal status: INITIAL   PLAN:  PT FREQUENCY: 1x/week  PT DURATION:  3 sessions  PLANNED INTERVENTIONS: 97110-Therapeutic exercises, 97530- Therapeutic activity, 97112- Neuromuscular re-education, 97535- Self Care, 16109- Manual therapy, [phone]- Aquatic Therapy, Patient/Family education, Taping, Dry Needling, Joint mobilization, Spinal mobilization, Scar mobilization, DME instructions, Cryotherapy, Moist heat, and Biofeedback  PLAN FOR NEXT SESSION: edu on labor positions, transverse abdominis activation, log rolling, breathing mechanics, pressure management, and coordination of pelvic floor with activity, knack   Otelia Sergeant, PT, DPT 01/23/257:08 PM

## 2024-01-15 NOTE — Patient Instructions (Signed)
  15-20 mins in the evenings based on tolerance.  Please clear any positions with doctor as needed.  Please stop any position if negative symptoms occur (dizziness/lightheadedness/fatigue/increased pain, etc.)  

## 2024-01-26 ENCOUNTER — Ambulatory Visit: Payer: BC Managed Care – PPO | Attending: Obstetrics and Gynecology | Admitting: Physical Therapy

## 2024-01-26 DIAGNOSIS — R293 Abnormal posture: Secondary | ICD-10-CM | POA: Insufficient documentation

## 2024-01-26 DIAGNOSIS — R279 Unspecified lack of coordination: Secondary | ICD-10-CM | POA: Insufficient documentation

## 2024-01-26 DIAGNOSIS — M6281 Muscle weakness (generalized): Secondary | ICD-10-CM | POA: Insufficient documentation

## 2024-01-26 NOTE — Therapy (Addendum)
 OUTPATIENT PHYSICAL THERAPY FEMALE PELVIC TREATMENT   Patient Name: Alyssa Montes MRN: [id number] DOB:03/11/1986, 38 y.o., female Today's Date: 01/26/2024  END OF SESSION:  PT End of Session - 01/26/24 1236     Visit Number 2    Date for PT Re-Evaluation 04/14/24    Authorization Type BCBS    PT Start Time 1236    PT Stop Time 1315    PT Time Calculation (min) 39 min    Activity Tolerance Patient tolerated treatment well    Behavior During Therapy [facility] for tasks assessed/performed             Past Medical History:  Diagnosis Date   Dysmenorrhea    PCOS (polycystic ovarian syndrome)    Past Surgical History:  Procedure Laterality Date   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   FOOT SURGERY Right 2008   Patient Active Problem List   Diagnosis Date Noted   SVD 5/18 05/10/2022   Postpartum care following vaginal delivery 5/18 05/10/2022   Second degree perineal laceration 05/10/2022   Normal labor and delivery 05/09/2022    PCP: Duncan Dull, MD  REFERRING PROVIDER: Olivia Mackie, MD  REFERRING DIAG: N81.89 (ICD-10-CM) - Weakening of pelvic fundus  THERAPY DIAG:  Muscle weakness (generalized)  Abnormal posture  Unspecified lack of coordination  Rationale for Evaluation and Treatment: Rehabilitation  ONSET DATE: Early 2024  SUBJECTIVE:                                                                                                                                                                                           SUBJECTIVE STATEMENT: Reports some improvement with not lifting children as much and states she now has been paying attention and feels lifting her kids a lot does make her pressure feel worse.    PAIN:  Are you having pain? No   PRECAUTIONS: Other: [redacted] weeks pregnant  RED FLAGS: None   WEIGHT BEARING RESTRICTIONS: No  FALLS:  Has patient fallen in last 6 months? No  LIVING ENVIRONMENT: Lives with: lives with their  family Lives in: House/apartment   OCCUPATION: marketing  PLOF: Independent  PATIENT GOALS: to be stronger at pelvic floor   PERTINENT HISTORY:  X2 vaginal births (3,1) and tearing (second degree with first) Sexual abuse: No  BOWEL MOVEMENT: Pain with bowel movement: No Type of bowel movement:Type (Bristol Stool Scale) 4, Frequency daily, and Strain No Fully empty rectum: Yes:   Leakage: No Pads: No Fiber supplement: No  URINATION: Pain with urination: No Fully empty bladder: Yes:   Stream: Strong Urgency: No Frequency: not quicker than every  2 hours, nightly  Leakage: Coughing and clearing her throat Pads: No  INTERCOURSE: Pain with intercourse:  no pain Ability to have vaginal penetration:  No Climax: not painful Marinoff Scale: 0/3  PREGNANCY: Vaginal deliveries 2 Tearing Yes: with first had grade 2 and long labor; did have tearing with second but not as bad.  C-section deliveries 0 Currently pregnant No  PROLAPSE: Yes with periods, with pregnancy a little more noticeable intermittently after being on feet awhile, carrying kids (3,1)   OBJECTIVE:  Note: Objective measures were completed at Evaluation unless otherwise noted.  DIAGNOSTIC FINDINGS:    COGNITION: Overall cognitive status: Within functional limits for tasks assessed     SENSATION: Light touch: Appears intact Proprioception: Appears intact  MUSCLE LENGTH: Bil hamstrings and adductors limited by 25%  LUMBAR SPECIAL TESTS:  Single leg stance test: hip drop noted with bil single leg stance after 3-5s each  FUNCTIONAL TESTS:  Functional squat - bil knee valgus, decreased descent by 25%  GAIT: WFL  POSTURE: rounded shoulders, forward head, and anterior pelvic tilt  PELVIC ALIGNMENT: WFL  LUMBARAROM/PROM:  A/PROM A/PROM  eval  Flexion WFL  Extension WFL  Right lateral flexion WFL  Left lateral flexion WFL  Right rotation Limited by 25%  Left rotation Limited by 25%   (Blank  rows = not tested)  LOWER EXTREMITY ROM:  WFL  LOWER EXTREMITY MMT:  Bil hips grossly 4/5; knees 5/5  PALPATION:   General  no TTP, mild tension at bil lumbar paraspinals and gluteals                External Perineal Exam WFL, does have thickening of perineal body but has had tearing with previous two children                             Internal Pelvic Floor mild TTP at Lt superficial pelvic floor at vaginal opening but denies feeling it with palpating it again at end of assessment  Patient confirms identification and approves PT to assess internal pelvic floor and treatment Yes 01/26/24  No emotional/communication barriers or cognitive limitation. Patient is motivated to learn. Patient understands and agrees with treatment goals and plan. PT explains patient will be examined in standing, sitting, and lying down to see how their muscles and joints work. When they are ready, they will be asked to remove their underwear so PT can examine their perineum. The patient is also given the option of providing their own chaperone as one is not provided in our facility. The patient also has the right and is explained the right to defer or refuse any part of the evaluation or treatment including the internal exam. With the patient's consent, PT will use one gloved finger to gently assess the muscles of the pelvic floor, seeing how well it contracts and relaxes and if there is muscle symmetry. After, the patient will get dressed and PT and patient will discuss exam findings and plan of care. PT and patient discuss plan of care, schedule, attendance policy and HEP activities.   PELVIC MMT:   MMT 01/26/24   Vaginal 4/5, 7s, 5 reps  Internal Anal Sphincter   External Anal Sphincter   Puborectalis   Diastasis Recti   (Blank rows = not tested)        TONE: WFL  PROLAPSE: Laxity Mildly noted at anterior vaginal wall in hooklying with cough but not moving forward or visualized well  with cough, felt  slight pressure with internal assessment.   TODAY'S TREATMENT:                                                                                                                              DATE:   01/15/24 EVAL Examination completed, findings reviewed, pt educated on POC, HEP, and pressure relief positions. Pt motivated to participate in PT and agreeable to attempt recommendations.    Pt educated on pregnancy precautions including but not limited to vaginal bleeding, contractions, leaking of fluid and change in fetal movement. If any of these occur halt activity and please consult OBGYN for recommended care.     01/26/24: Patient consented to internal pelvic floor assessment vaginally this date and found to have mildly decreased strength, endurance, and coordination. Able to complete contractions well but was assessed in hooklying not standing today. Moderate cues for exhale and pelvic floor contraction Pt educated on activity modifications for carrying children to decreased strain at pelvic floor  Seated transverse abdominis activation - needed max cues at first to coordinate core activation with exhale improved with ball press techniques  2x10 2x10 transverse abdominis +exhale +pelvic floor contraction  PATIENT EDUCATION:  Education details: [id number] Person educated: Patient Education method: Explanation, Demonstration, Tactile cues, Verbal cues, and Handouts Education comprehension: verbalized understanding, returned demonstration, verbal cues required, tactile cues required, and needs further education  HOME EXERCISE PROGRAM: [id number], pressure relief positions   ASSESSMENT:  CLINICAL IMPRESSION: Patient presents for treatment tolerated well, consented to internal assessment of pelvic floor and details above. Pt denied pain or concerns. Demonstrated difficulty with coordination and quick flicks and decreased endurance. Pt also had difficulty activating transverse abdominis in sitting and  benefited from moderate-max cues to improve. Pt also educated on modifications with carrying children for decreased strain at pelvic floor. Pt would benefit from additional PT to further address deficits.    OBJECTIVE IMPAIRMENTS: decreased activity tolerance, decreased coordination, decreased endurance, decreased mobility, decreased strength, increased fascial restrictions, impaired flexibility, improper body mechanics, and postural dysfunction.   ACTIVITY LIMITATIONS: continence, locomotion level, and caring for others  PARTICIPATION LIMITATIONS: community activity  PERSONAL FACTORS: Time since onset of injury/illness/exacerbation and 1 comorbidity: x2 vaginal births and currently pregnant  are also affecting patient's functional outcome.   REHAB POTENTIAL: Good  CLINICAL DECISION MAKING: Stable/uncomplicated  EVALUATION COMPLEXITY: Low   GOALS: Goals reviewed with patient? Yes  SHORT TERM GOALS: Target date: 02/12/24  Pt to be I with HEP.  Baseline: Goal status: INITIAL  2.  Pt to be I with pressure management for decreased strain at pelvic floor with lifting toddlers.  Baseline:  Goal status: INITIAL  3.  Pt to be I with knack for decreased urinary incontinence with stressors such as coughing and lifting.  Baseline:  Goal status: INITIAL   LONG TERM GOALS: Target date: 04/14/24  Pt to be I with advanced HEP.  Baseline:  Goal status: INITIAL  2.  Pt to demonstrate at least 5/5 bil hip strength for improved pelvic stability and functional squats without leakage and to prepare for physical demands postpartum. .  Baseline:  Goal status: INITIAL  3.  Pt to demonstrate improved coordination of pelvic floor and breathing mechanics with 10# squat with appropriate synergistic patterns to decrease pain and leakage at least 75% of the time.    Baseline:  Goal status: INITIAL  4.  Pt to report no more than 1 instance of urinary incontinence in a day due to improved support from  pelvic floor to decreased leakage.  Baseline:  Goal status: INITIAL   PLAN:  PT FREQUENCY: 1x/week  PT DURATION:  3 sessions  PLANNED INTERVENTIONS: 97110-Therapeutic exercises, 97530- Therapeutic activity, 97112- Neuromuscular re-education, 97535- Self Care, 16109- Manual therapy, [phone]- Aquatic Therapy, Patient/Family education, Taping, Dry Needling, Joint mobilization, Spinal mobilization, Scar mobilization, DME instructions, Cryotherapy, Moist heat, and Biofeedback  PLAN FOR NEXT SESSION: edu on labor positions, transverse abdominis activation, log rolling, breathing mechanics, pressure management, and coordination of pelvic floor with activity, knack   Otelia Sergeant, PT, DPT 02/03/252:56 PM   PHYSICAL THERAPY DISCHARGE SUMMARY  Visits from Start of Care: 2  Current functional level related to goals / functional outcomes: Pt requests DC - feeling better   Remaining deficits: Unable to formally reassess as pt requests DC   Education / Equipment: HEP   Patient agrees to discharge. Patient goals were partially met. Patient is being discharged due to being pleased with the current functional level.   Otelia Sergeant, PT, DPT 02/11/2510:07 AM

## 2024-01-28 DIAGNOSIS — Z361 Encounter for antenatal screening for raised alphafetoprotein level: Secondary | ICD-10-CM | POA: Diagnosis not present

## 2024-02-12 ENCOUNTER — Ambulatory Visit: Payer: BC Managed Care – PPO | Admitting: Physical Therapy

## 2024-02-14 DIAGNOSIS — Z3A19 19 weeks gestation of pregnancy: Secondary | ICD-10-CM | POA: Diagnosis not present

## 2024-02-14 DIAGNOSIS — J209 Acute bronchitis, unspecified: Secondary | ICD-10-CM | POA: Diagnosis not present

## 2024-02-17 DIAGNOSIS — Z3A19 19 weeks gestation of pregnancy: Secondary | ICD-10-CM | POA: Diagnosis not present

## 2024-02-17 DIAGNOSIS — D2261 Melanocytic nevi of right upper limb, including shoulder: Secondary | ICD-10-CM | POA: Diagnosis not present

## 2024-02-17 DIAGNOSIS — O09522 Supervision of elderly multigravida, second trimester: Secondary | ICD-10-CM | POA: Diagnosis not present

## 2024-02-17 DIAGNOSIS — D2272 Melanocytic nevi of left lower limb, including hip: Secondary | ICD-10-CM | POA: Diagnosis not present

## 2024-02-17 DIAGNOSIS — D2262 Melanocytic nevi of left upper limb, including shoulder: Secondary | ICD-10-CM | POA: Diagnosis not present

## 2024-02-17 DIAGNOSIS — D225 Melanocytic nevi of trunk: Secondary | ICD-10-CM | POA: Diagnosis not present

## 2024-03-02 DIAGNOSIS — Z362 Encounter for other antenatal screening follow-up: Secondary | ICD-10-CM | POA: Diagnosis not present

## 2024-04-13 DIAGNOSIS — Z3689 Encounter for other specified antenatal screening: Secondary | ICD-10-CM | POA: Diagnosis not present

## 2024-04-27 DIAGNOSIS — Z3689 Encounter for other specified antenatal screening: Secondary | ICD-10-CM | POA: Diagnosis not present

## 2024-04-27 LAB — OB RESULTS CONSOLE RPR: RPR: NONREACTIVE

## 2024-05-11 DIAGNOSIS — Z3A31 31 weeks gestation of pregnancy: Secondary | ICD-10-CM | POA: Diagnosis not present

## 2024-05-11 DIAGNOSIS — O24419 Gestational diabetes mellitus in pregnancy, unspecified control: Secondary | ICD-10-CM | POA: Diagnosis not present

## 2024-05-27 DIAGNOSIS — O24410 Gestational diabetes mellitus in pregnancy, diet controlled: Secondary | ICD-10-CM | POA: Diagnosis not present

## 2024-05-27 DIAGNOSIS — Z3A33 33 weeks gestation of pregnancy: Secondary | ICD-10-CM | POA: Diagnosis not present

## 2024-05-27 DIAGNOSIS — O99013 Anemia complicating pregnancy, third trimester: Secondary | ICD-10-CM | POA: Diagnosis not present

## 2024-06-03 DIAGNOSIS — O368390 Maternal care for abnormalities of the fetal heart rate or rhythm, unspecified trimester, not applicable or unspecified: Secondary | ICD-10-CM | POA: Diagnosis not present

## 2024-06-03 DIAGNOSIS — Z3A34 34 weeks gestation of pregnancy: Secondary | ICD-10-CM | POA: Diagnosis not present

## 2024-06-03 DIAGNOSIS — J019 Acute sinusitis, unspecified: Secondary | ICD-10-CM | POA: Diagnosis not present

## 2024-06-11 DIAGNOSIS — Z3A35 35 weeks gestation of pregnancy: Secondary | ICD-10-CM | POA: Diagnosis not present

## 2024-06-11 DIAGNOSIS — Z3685 Encounter for antenatal screening for Streptococcus B: Secondary | ICD-10-CM | POA: Diagnosis not present

## 2024-06-11 DIAGNOSIS — O24419 Gestational diabetes mellitus in pregnancy, unspecified control: Secondary | ICD-10-CM | POA: Diagnosis not present

## 2024-06-14 LAB — OB RESULTS CONSOLE GBS: GBS: NEGATIVE

## 2024-07-06 DIAGNOSIS — Z3A39 39 weeks gestation of pregnancy: Secondary | ICD-10-CM | POA: Diagnosis not present

## 2024-07-06 DIAGNOSIS — O09523 Supervision of elderly multigravida, third trimester: Secondary | ICD-10-CM | POA: Diagnosis not present

## 2024-07-06 DIAGNOSIS — O24419 Gestational diabetes mellitus in pregnancy, unspecified control: Secondary | ICD-10-CM | POA: Diagnosis not present

## 2024-07-07 ENCOUNTER — Encounter (HOSPITAL_COMMUNITY): Payer: Self-pay | Admitting: Obstetrics and Gynecology

## 2024-07-07 ENCOUNTER — Inpatient Hospital Stay (HOSPITAL_COMMUNITY): Admitting: Anesthesiology

## 2024-07-07 ENCOUNTER — Inpatient Hospital Stay (HOSPITAL_COMMUNITY)
Admission: AD | Admit: 2024-07-07 | Discharge: 2024-07-08 | DRG: 807 | Disposition: A | Discharge status: Home / Self Care | Attending: Obstetrics & Gynecology | Admitting: Obstetrics & Gynecology

## 2024-07-07 DIAGNOSIS — Z8249 Family history of ischemic heart disease and other diseases of the circulatory system: Secondary | ICD-10-CM

## 2024-07-07 DIAGNOSIS — O24419 Gestational diabetes mellitus in pregnancy, unspecified control: Secondary | ICD-10-CM | POA: Diagnosis present

## 2024-07-07 DIAGNOSIS — S3141XA Laceration without foreign body of vagina and vulva, initial encounter: Secondary | ICD-10-CM

## 2024-07-07 DIAGNOSIS — Z0542 Observation and evaluation of newborn for suspected metabolic condition ruled out: Secondary | ICD-10-CM | POA: Diagnosis not present

## 2024-07-07 DIAGNOSIS — O24410 Gestational diabetes mellitus in pregnancy, diet controlled: Secondary | ICD-10-CM

## 2024-07-07 DIAGNOSIS — O24420 Gestational diabetes mellitus in childbirth, diet controlled: Secondary | ICD-10-CM | POA: Diagnosis not present

## 2024-07-07 DIAGNOSIS — Z3A00 Weeks of gestation of pregnancy not specified: Secondary | ICD-10-CM | POA: Diagnosis not present

## 2024-07-07 DIAGNOSIS — O26893 Other specified pregnancy related conditions, third trimester: Secondary | ICD-10-CM | POA: Diagnosis not present

## 2024-07-07 DIAGNOSIS — Z3A39 39 weeks gestation of pregnancy: Secondary | ICD-10-CM | POA: Diagnosis not present

## 2024-07-07 DIAGNOSIS — O3663X0 Maternal care for excessive fetal growth, third trimester, not applicable or unspecified: Secondary | ICD-10-CM | POA: Diagnosis not present

## 2024-07-07 DIAGNOSIS — O9902 Anemia complicating childbirth: Secondary | ICD-10-CM | POA: Diagnosis not present

## 2024-07-07 DIAGNOSIS — Z23 Encounter for immunization: Secondary | ICD-10-CM | POA: Diagnosis not present

## 2024-07-07 HISTORY — DX: Gestational diabetes mellitus in pregnancy, unspecified control: O24.419

## 2024-07-07 HISTORY — DX: Laceration without foreign body of vagina and vulva, initial encounter: S31.41XA

## 2024-07-07 LAB — POCT FERN TEST: POCT Fern Test: POSITIVE

## 2024-07-07 LAB — CBC
HCT: 37 % (ref 36.0–46.0)
Hemoglobin: 12.7 g/dL (ref 12.0–15.0)
MCH: 30.3 pg (ref 26.0–34.0)
MCHC: 34.3 g/dL (ref 30.0–36.0)
MCV: 88.3 fL (ref 80.0–100.0)
Platelets: 143 K/uL — ABNORMAL LOW (ref 150–400)
RBC: 4.19 MIL/uL (ref 3.87–5.11)
RDW: 15.9 % — ABNORMAL HIGH (ref 11.5–15.5)
WBC: 6.9 K/uL (ref 4.0–10.5)
nRBC: 0 % (ref 0.0–0.2)

## 2024-07-07 LAB — TYPE AND SCREEN
ABO/RH(D): B POS
Antibody Screen: NEGATIVE

## 2024-07-07 LAB — RPR: RPR Ser Ql: NONREACTIVE

## 2024-07-07 MED ORDER — PRENATAL MULTIVITAMIN CH
1.0000 | ORAL_TABLET | Freq: Every day | ORAL | Status: DC
  Administered 2024-07-07: 1 via ORAL
  Filled 2024-07-07: qty 1

## 2024-07-07 MED ORDER — ONDANSETRON HCL 4 MG/2ML IJ SOLN: 4.0000 mg | INTRAMUSCULAR | Status: DC | PRN

## 2024-07-07 MED ORDER — OXYTOCIN BOLUS FROM INFUSION: 333.0000 mL | Freq: Once | INTRAVENOUS | Status: DC

## 2024-07-07 MED ORDER — SENNOSIDES-DOCUSATE SODIUM 8.6-50 MG PO TABS
2.0000 | ORAL_TABLET | Freq: Every day | ORAL | Status: DC
  Administered 2024-07-08: 2 via ORAL
  Filled 2024-07-07: qty 2

## 2024-07-07 MED ORDER — WITCH HAZEL-GLYCERIN EX PADS: 1.0000 | MEDICATED_PAD | CUTANEOUS | Status: DC | PRN

## 2024-07-07 MED ORDER — EPHEDRINE 5 MG/ML INJ: 10.0000 mg | INTRAVENOUS | Status: DC | PRN

## 2024-07-07 MED ORDER — PHENYLEPHRINE 80 MCG/ML (10ML) SYRINGE FOR IV PUSH (FOR BLOOD PRESSURE SUPPORT)
80.0000 ug | PREFILLED_SYRINGE | INTRAVENOUS | Status: DC | PRN
  Administered 2024-07-07: 80 ug via INTRAVENOUS

## 2024-07-07 MED ORDER — ONDANSETRON HCL 4 MG/2ML IJ SOLN: 4.0000 mg | Freq: Four times a day (QID) | INTRAMUSCULAR | Status: DC | PRN

## 2024-07-07 MED ORDER — DIPHENHYDRAMINE HCL 25 MG PO CAPS: 25.0000 mg | ORAL_CAPSULE | Freq: Four times a day (QID) | ORAL | Status: DC | PRN

## 2024-07-07 MED ORDER — FLEET ENEMA RE ENEM: 1.0000 | ENEMA | RECTAL | Status: DC | PRN

## 2024-07-07 MED ORDER — DIBUCAINE (PERIANAL) 1 % EX OINT: 1.0000 | TOPICAL_OINTMENT | CUTANEOUS | Status: DC | PRN

## 2024-07-07 MED ORDER — OXYTOCIN-SODIUM CHLORIDE 30-0.9 UT/500ML-% IV SOLN
2.5000 [IU]/h | INTRAVENOUS | Status: DC
  Filled 2024-07-07: qty 500

## 2024-07-07 MED ORDER — ACETAMINOPHEN 325 MG PO TABS: 650.0000 mg | ORAL_TABLET | ORAL | Status: DC | PRN

## 2024-07-07 MED ORDER — LACTATED RINGERS IV SOLN: 500.0000 mL | Freq: Once | INTRAVENOUS | Status: DC

## 2024-07-07 MED ORDER — FENTANYL CITRATE (PF) 100 MCG/2ML IJ SOLN
100.0000 ug | Freq: Once | INTRAMUSCULAR | Status: AC
  Administered 2024-07-07: 100 ug via INTRAVENOUS
  Filled 2024-07-07: qty 2

## 2024-07-07 MED ORDER — LIDOCAINE HCL (PF) 1 % IJ SOLN
INTRAMUSCULAR | Status: DC | PRN
  Administered 2024-07-07: 8 mL via EPIDURAL

## 2024-07-07 MED ORDER — LACTATED RINGERS IV SOLN: INTRAVENOUS | Status: DC

## 2024-07-07 MED ORDER — OXYCODONE-ACETAMINOPHEN 5-325 MG PO TABS: 2.0000 | ORAL_TABLET | ORAL | Status: DC | PRN

## 2024-07-07 MED ORDER — BENZOCAINE-MENTHOL 20-0.5 % EX AERO
1.0000 | INHALATION_SPRAY | CUTANEOUS | Status: DC | PRN
  Filled 2024-07-07: qty 56

## 2024-07-07 MED ORDER — SOD CITRATE-CITRIC ACID 500-334 MG/5ML PO SOLN
30.0000 mL | ORAL | Status: DC | PRN
Start: 2024-07-07 — End: 2024-07-07

## 2024-07-07 MED ORDER — FENTANYL-BUPIVACAINE-NACL 0.5-0.125-0.9 MG/250ML-% EP SOLN
12.0000 mL/h | EPIDURAL | Status: DC | PRN
  Administered 2024-07-07: 12 mL/h via EPIDURAL

## 2024-07-07 MED ORDER — SIMETHICONE 80 MG PO CHEW: 80.0000 mg | CHEWABLE_TABLET | ORAL | Status: DC | PRN

## 2024-07-07 MED ORDER — LACTATED RINGERS IV SOLN: 500.0000 mL | INTRAVENOUS | Status: DC | PRN

## 2024-07-07 MED ORDER — ZOLPIDEM TARTRATE 5 MG PO TABS: 5.0000 mg | ORAL_TABLET | Freq: Every evening | ORAL | Status: DC | PRN

## 2024-07-07 MED ORDER — TETANUS-DIPHTH-ACELL PERTUSSIS 5-2.5-18.5 LF-MCG/0.5 IM SUSY: 0.5000 mL | PREFILLED_SYRINGE | Freq: Once | INTRAMUSCULAR | Status: DC

## 2024-07-07 MED ORDER — OXYCODONE-ACETAMINOPHEN 5-325 MG PO TABS: 1.0000 | ORAL_TABLET | ORAL | Status: DC | PRN

## 2024-07-07 MED ORDER — PHENYLEPHRINE 80 MCG/ML (10ML) SYRINGE FOR IV PUSH (FOR BLOOD PRESSURE SUPPORT)
80.0000 ug | PREFILLED_SYRINGE | INTRAVENOUS | Status: DC | PRN
  Filled 2024-07-07: qty 10

## 2024-07-07 MED ORDER — FENTANYL-BUPIVACAINE-NACL 0.5-0.125-0.9 MG/250ML-% EP SOLN
EPIDURAL | Status: AC
  Filled 2024-07-07: qty 250

## 2024-07-07 MED ORDER — DIPHENHYDRAMINE HCL 50 MG/ML IJ SOLN: 12.5000 mg | INTRAMUSCULAR | Status: DC | PRN

## 2024-07-07 MED ORDER — COCONUT OIL OIL
1.0000 | TOPICAL_OIL | Status: DC | PRN
Start: 2024-07-07 — End: 2024-07-08

## 2024-07-07 MED ORDER — ONDANSETRON HCL 4 MG PO TABS: 4.0000 mg | ORAL_TABLET | ORAL | Status: DC | PRN

## 2024-07-07 MED ORDER — IBUPROFEN 600 MG PO TABS
600.0000 mg | ORAL_TABLET | Freq: Four times a day (QID) | ORAL | Status: DC
  Administered 2024-07-07 – 2024-07-08 (×4): 600 mg via ORAL
  Filled 2024-07-07 (×4): qty 1

## 2024-07-07 MED ORDER — LIDOCAINE HCL (PF) 1 % IJ SOLN: 30.0000 mL | INTRAMUSCULAR | Status: DC | PRN

## 2024-07-07 NOTE — Anesthesia Procedure Notes (Signed)
 Epidural Patient location during procedure: OB Start time: 07/07/2024 3:48 AM End time: 07/07/2024 3:58 AM  Staffing Anesthesiologist: Merla Almarie HERO, DO Performed: anesthesiologist   Preanesthetic Checklist Completed: patient identified, IV checked, risks and benefits discussed, monitors and equipment checked, pre-op evaluation and timeout performed  Epidural Patient position: sitting Prep: DuraPrep and site prepped and draped Patient monitoring: continuous pulse ox, blood pressure, heart rate and cardiac monitor Approach: midline Location: L3-L4 Injection technique: LOR air  Needle:  Needle type: Tuohy  Needle gauge: 17 G Needle length: 9 cm Needle insertion depth: 5 cm Catheter type: closed end flexible Catheter size: 19 Gauge Catheter at skin depth: 10 cm Test dose: negative  Assessment Sensory level: T8 Events: blood not aspirated, no cerebrospinal fluid, injection not painful, no injection resistance, no paresthesia and negative IV test  Additional Notes Patient identified. Risks/Benefits/Options discussed with patient including but not limited to bleeding, infection, nerve damage, paralysis, failed block, incomplete pain control, headache, blood pressure changes, nausea, vomiting, reactions to medication both or allergic, itching and postpartum back pain. Confirmed with bedside nurse the patient's most recent platelet count. Confirmed with patient that they are not currently taking any anticoagulation, have any bleeding history or any family history of bleeding disorders. Patient expressed understanding and wished to proceed. All questions were answered. Sterile technique was used throughout the entire procedure. Please see nursing notes for vital signs. Test dose was given through epidural catheter and negative prior to continuing to dose epidural or start infusion. Warning signs of high block given to the patient including shortness of breath, tingling/numbness in  hands, complete motor block, or any concerning symptoms with instructions to call for help. Patient was given instructions on fall risk and not to get out of bed. All questions and concerns addressed with instructions to call with any issues or inadequate analgesia.  Reason for block:procedure for pain

## 2024-07-07 NOTE — Anesthesia Preprocedure Evaluation (Signed)
 Anesthesia Evaluation  Patient identified by MRN, date of birth, ID band Patient awake    Reviewed: Allergy & Precautions, Patient's Chart, lab work & pertinent test results  Airway Mallampati: II  TM Distance: >3 FB Neck ROM: Full    Dental no notable dental hx.    Pulmonary neg pulmonary ROS   Pulmonary exam normal breath sounds clear to auscultation       Cardiovascular negative cardio ROS Normal cardiovascular exam Rhythm:Regular Rate:Normal     Neuro/Psych negative neurological ROS  negative psych ROS   GI/Hepatic negative GI ROS, Neg liver ROS,,,  Endo/Other  negative endocrine ROS    Renal/GU negative Renal ROS  negative genitourinary   Musculoskeletal negative musculoskeletal ROS (+)    Abdominal   Peds negative pediatric ROS (+)  Hematology negative hematology ROS (+) Hb 12.7, plt 143   Anesthesia Other Findings   Reproductive/Obstetrics (+) Pregnancy                              Anesthesia Physical Anesthesia Plan  ASA: 2  Anesthesia Plan: Epidural   Post-op Pain Management:    Induction:   PONV Risk Score and Plan: 2  Airway Management Planned: Natural Airway  Additional Equipment: None  Intra-op Plan:   Post-operative Plan:   Informed Consent: I have reviewed the patients History and Physical, chart, labs and discussed the procedure including the risks, benefits and alternatives for the proposed anesthesia with the patient or authorized representative who has indicated his/her understanding and acceptance.       Plan Discussed with:   Anesthesia Plan Comments:         Anesthesia Quick Evaluation

## 2024-07-07 NOTE — MAU Note (Signed)
 Alyssa Montes is a 38 y.o. at [redacted]w[redacted]d here in MAU reporting srom at 0200 with cl fld. Ctxs close and strong. 2cm in office Tues. Good FM  LMP: na Onset of complaint: 0200 Pain score: 8 Vitals:   07/07/24 0226  Pulse: 95  Resp: 18  SpO2: 100%     FHT: 128  Lab orders placed from triage: labor

## 2024-07-07 NOTE — H&P (Signed)
 OB ADMISSION/ HISTORY & PHYSICAL:  Admission Date: 07/07/2024  2:18 AM  Admit Diagnosis: Indication for care in labor and delivery, antepartum [O75.9]    Alyssa Montes is a 38 y.o. female G3P2002 at [redacted]w[redacted]d presenting for labor. Reports cramping last night and SROM at 0145 with clear fluid. Denies vaginal bleeding. Endorses + fetal movement. Husband, [name], present and supportive. Eagerly anticipating a baby girl Alyssa Montes.   Prenatal History: [id number]   EDC: 07/10/2024 P[facility] care at [facility] since 10 weeks  Primary: A. Joshua, CNM, transfer from Dr. Gorge  Prenatal course complicated by: A1GDM, good CBG control Anemia, on PO iron  History of LGA, pelvis proven to 9lb 1oz AMA  Prenatal Labs: ABO, Rh:   B POS Antibody: PENDING (07/16 0307) Rubella:   Immune RPR:   Non-reactive HBsAg:   Negative HIV:   Negative GBS:   Negative 1 hr Glucola : 224, A1GDM Genetic Screening: low risk Panorama XX Ultrasound: normal XX anatomy with exception of incidental finding of CPC cyst at [redacted]w[redacted]d, posterior placenta, EFW 85% at 35 weeks    Maternal Diabetes: Yes:  Diabetes Type:  Diet controlled Genetic Screening: Normal Maternal Ultrasounds/Referrals: Normal Fetal Ultrasounds or other Referrals:  None Maternal Substance Abuse:  No Significant Maternal Medications:  None Significant Maternal Lab Results:  Group B Strep negative Other Comments:  None  Medical / Surgical History : Past medical history:  Past Medical History:  Diagnosis Date   Dysmenorrhea    PCOS (polycystic ovarian syndrome)     Past surgical history:  Past Surgical History:  Procedure Laterality Date   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   FOOT SURGERY Right 2008    Family History:  Family History  Problem Relation Age of Onset   Hyperlipidemia Father    Stroke Maternal Grandfather    Heart disease Maternal Grandfather    Heart disease Paternal Grandfather    Stroke Paternal Grandfather      Social History:  reports that she has never smoked. She has never used smokeless tobacco. She reports that she does not currently use alcohol. She reports that she does not use drugs.  Allergies: Patient has no known allergies.   Current Medications at time of admission:  Medications Prior to Admission  Medication Sig Dispense Refill Last Dose/Taking   famotidine  (PEPCID  AC MAXIMUM STRENGTH) 20 MG tablet Take 20 mg by mouth daily as needed for heartburn.   07/06/2024   ferrous sulfate 325 (65 FE) MG EC tablet Take 325 mg by mouth 3 (three) times daily with meals.   Taking   Prenatal Vit-Fe Fumarate-FA (PRENATAL PO) Take 1 tablet by mouth daily.   07/06/2024   acetaminophen  (TYLENOL ) 325 MG tablet Take 2 tablets (650 mg total) by mouth every 4 (four) hours as needed (for pain scale < 4). (Patient not taking: Reported on 01/15/2024)      calcium  carbonate (TUMS EX) 750 MG chewable tablet Chew 3,000 mg by mouth daily as needed for heartburn.      Cholecalciferol (VITAMIN D-3) 25 MCG (1000 UT) CAPS Take 1,000 Units by mouth daily.      iron  polysaccharides (FERREX 150) 150 MG capsule Take 1 capsule (150 mg total) by mouth daily. (Patient not taking: Reported on 05/09/2022)      Magnesium  Oxide 400 (240 Mg) MG TABS Take 1 tablet (400 mg total) by mouth daily. For prevention of constipation. (Patient not taking: Reported on 05/09/2022) 30 tablet  Review of Systems: Review of Systems  All other systems reviewed and are negative.  Physical Exam: Vital signs and nursing notes reviewed.  Patient Vitals for the past 24 hrs:  BP Temp Pulse Resp SpO2 Height Weight  07/07/24 0246 -- 97.6 F (36.4 C) -- -- -- -- --  07/07/24 0230 120/62 -- -- -- -- -- --  07/07/24 0226 -- -- 95 18 100 % 5' 5 (1.651 m) 76.2 kg    General: AAO x 3, NAD Heart: RRR Lungs:CTAB Abdomen: Gravid, NT Extremities: no edema SVE: Dilation: 3 Effacement (%): 70 Cervical Position: Anterior Station: -3 Presentation:  Vertex Exam by:: Idell Gunning rNC   FHR: 120BPM, moderate variability, + accels, no decels TOCO: Contractions q 2-4 minutes  Labs:   Recent Labs    07/07/24 0310  WBC 6.9  HGB 12.7  HCT 37.0  PLT 143*   Assessment/Plan: 38 y.o. G3P2002 at [redacted]w[redacted]d, labor with SROM at 0140 with clear fluid A1GDM with good CBG control Incidental finding of CPC cyst on BPP at [redacted]w[redacted]d, low risk Panorama Anemia, on PO iron , admission Hgb 12.7 History of LGA, pelvis proven to 9lb 1oz  Fetal wellbeing - FHT category 1 EFW LGA 8-9lbs  Labor: Plan expectant management, Pitocin  prn  GBS negative Rubella immune Rh positive  Pain control: desires epidural, may have upon request Analgesia/anesthesia PRN  Anticipated MOD: NSVB  Plans to breastfeed. POC discussed with patient and support team, all questions answered.  Dr. Barbette notified of admission/plan of care.  Alan MARLA Molt CNM, MSN 07/07/2024, 3:40 AM

## 2024-07-08 LAB — CBC
HCT: 32.3 % — ABNORMAL LOW (ref 36.0–46.0)
Hemoglobin: 10.9 g/dL — ABNORMAL LOW (ref 12.0–15.0)
MCH: 30.6 pg (ref 26.0–34.0)
MCHC: 33.7 g/dL (ref 30.0–36.0)
MCV: 90.7 fL (ref 80.0–100.0)
Platelets: 140 K/uL — ABNORMAL LOW (ref 150–400)
RBC: 3.56 MIL/uL — ABNORMAL LOW (ref 3.87–5.11)
RDW: 16.2 % — ABNORMAL HIGH (ref 11.5–15.5)
WBC: 6.5 K/uL (ref 4.0–10.5)
nRBC: 0 % (ref 0.0–0.2)

## 2024-07-08 MED ORDER — SENNOSIDES-DOCUSATE SODIUM 8.6-50 MG PO TABS: 2.0000 | ORAL_TABLET | Freq: Every day | ORAL | 0 refills | Status: DC

## 2024-07-08 MED ORDER — IBUPROFEN 600 MG PO TABS: 600.0000 mg | ORAL_TABLET | Freq: Four times a day (QID) | ORAL | 0 refills | Status: DC

## 2024-07-08 NOTE — Anesthesia Postprocedure Evaluation (Signed)
 Anesthesia Post Note  Patient: Alyssa Montes  Procedure(s) Performed: AN AD HOC LABOR EPIDURAL     Patient location during evaluation: Mother Baby Anesthesia Type: Epidural Level of consciousness: awake, oriented and awake and alert Pain management: pain level controlled Vital Signs Assessment: post-procedure vital signs reviewed and stable Respiratory status: spontaneous breathing, nonlabored ventilation and respiratory function stable Cardiovascular status: stable Postop Assessment: no headache, adequate PO intake, patient able to bend at knees, no apparent nausea or vomiting and able to ambulate Anesthetic complications: no   No notable events documented.  Last Vitals:  Vitals:   07/07/24 2100 07/08/24 0508  BP: 126/87 114/78  Pulse: 63 65  Resp: 16 16  Temp: 37.1 C 36.7 C  SpO2: 100% 100%    Last Pain:  Vitals:   07/08/24 0710  TempSrc:   PainSc: 0-No pain   Pain Goal: Patients Stated Pain Goal: 0 (07/07/24 0230)                 Christiane Sistare

## 2024-07-08 NOTE — Discharge Summary (Signed)
 Postpartum Discharge Summary  Date of Service updated 7/17.      Patient Name: Alyssa Montes DOB: [birth year] MRN: [id number]  Date of admission: 07/07/2024 Delivery date:07/07/2024 Delivering provider: JOSHUA PALMA K Date of discharge: 07/08/2024  Admitting diagnosis: Indication for care in labor and delivery, antepartum [O75.9] Intrauterine pregnancy: [redacted]w[redacted]d     Secondary diagnosis:  Principal Problem:   Postpartum care following vaginal delivery 7/16 Active Problems:   Normal labor and delivery   SVD (spontaneous vaginal delivery)   Vaginal laceration and right labial laceration with repair   Gestational diabetes  Additional problems: GDMA1, acute anemia of pregnancy, 2nd vaginal obstetric laceration.    Discharge diagnosis: Term Pregnancy Delivered and GDM A1                                              Augmentation: N/A Complications: None  Hospital course: Onset of Labor With Vaginal Delivery      38 y.o. yo G3P3003 at [redacted]w[redacted]d was admitted in Active Labor on 07/07/2024. Labor course was complicated by AMA, GDMA1, anemia of pregnancy.  Membrane Rupture Time/Date: 2:00 AM,07/07/2024  Delivery Method:Vaginal, Spontaneous Operative Delivery:N/A Episiotomy: None Lacerations:  Vaginal;Labial *See todays pptm progress note.  Newborn Data: Birth date:07/07/2024 Birth time:5:29 AM Gender:Female Living status:Living Apgars:8 ,9  Weight:3950 g  Magnesium  Sulfate received: No BMZ received: No Rhophylac:No MMR:No T-DaP:Given prenatally Flu: No RSV Vaccine received: No Transfusion:No Immunizations administered: Immunization History  Administered Date(s) Administered   DTaP 12/23/2017   Typhoid Live 01/07/2018    Physical exam  * see today's pptm progress note. Lab Results  Component Value Date   WBC 6.5 07/08/2024   HGB 10.9 (L) 07/08/2024   HCT 32.3 (L) 07/08/2024   MCV 90.7 07/08/2024   PLT 140 (L) 07/08/2024     After visit meds:  Allergies as of  07/08/2024   No Known Allergies      Medication List     STOP taking these medications    iron  polysaccharides 150 MG capsule Commonly known as: Ferrex 150       TAKE these medications    acetaminophen  325 MG tablet Commonly known as: Tylenol  Take 2 tablets (650 mg total) by mouth every 4 (four) hours as needed (for pain scale < 4).   calcium  carbonate 750 MG chewable tablet Commonly known as: TUMS EX Chew 3,000 mg by mouth daily as needed for heartburn.   ferrous sulfate 325 (65 FE) MG EC tablet Take 325 mg by mouth 3 (three) times daily with meals.   ibuprofen  600 MG tablet Commonly known as: ADVIL  Take 1 tablet (600 mg total) by mouth every 6 (six) hours.   magnesium  oxide 400 (240 Mg) MG tablet Commonly known as: MAG-OX Take 1 tablet (400 mg total) by mouth daily. For prevention of constipation.   Pepcid  AC Maximum Strength 20 MG tablet Generic drug: famotidine  Take 20 mg by mouth daily as needed for heartburn.   PRENATAL PO Take 1 tablet by mouth daily.   senna-docusate 8.6-50 MG tablet Commonly known as: Senokot-S Take 2 tablets by mouth daily. Start taking on: July 09, 2024   Vitamin D-3 25 MCG (1000 UT) Caps Take 1,000 Units by mouth daily.         Discharge home in stable condition Infant Feeding: Bottle and Breast Infant Disposition:home with mother Discharge  instruction: per After Visit Summary and Postpartum booklet. Activity: Advance as tolerated. Pelvic rest for 6 weeks.  Diet: routine diet Anticipated Birth Control: Unsure Postpartum Appointment:6 weeks Additional Postpartum F/U: 2 hour GTT   Follow up Visit:  Follow-up Information     Joshua Alan POUR, CNM. Schedule an appointment as soon as possible for a visit in 6 week(s).   Specialty: Certified Nurse Midwife Why: 6-week pptm visit, GDMA1 will need 2-hour glucose testing Contact information: [address] [place] [address] [phone]                      07/08/2024 Sherrilee KANDICE Both, CNM

## 2024-07-08 NOTE — Progress Notes (Signed)
   PPD #2 S/P NSVD  Live born female  Birth Weight: 8 lb 11.3 oz (3950 g) APGAR: 8, 9  Newborn Delivery   Birth date/time: 07/07/2024 05:29:32 Delivery type: Vaginal, Spontaneous    Baby name: Girl: Velia.   Delivering provider: JOSHUA PALMA K  Lacerations: Vaginal;Labial  Feeding: breast and bottle, reporting minimal supply, seeing lactation as desired.   Pain control at delivery: Epidural  S:  Reports feeling well overall. Ambulating without concern.              Tolerating PO/No nausea or vomiting             Bleeding is light, more trickle with movement/position changes.              Pain controlled with acetaminophen  and ibuprofen  (OTC). Dermoplast for perineal pain/discomfort.   Up ad lib/ambulatory/voiding without difficulties. No BM at this time but feels flatus, to go home with stool softener as needed.  Anemia of pregnancy: Denies SOB, palpitations or fatigue at this time.  GDMA1: Feeling well, reports still focusing on protein and vegetables for pptm healing.   O:  A & O x 3, in no apparent distress.               VS:  Vitals:   07/07/24 1241 07/07/24 1816 07/07/24 2100 07/08/24 0508  BP: 105/70 125/79 126/87 114/78  Pulse: 67 74 63 65  Resp: 16 18 16 16   Temp: 98.9 F (37.2 C) 98.3 F (36.8 C) 98.7 F (37.1 C) 98 F (36.7 C)  TempSrc: Oral Oral Oral Oral  SpO2: 100% 99% 100% 100%  Weight:      Height:        LABS:  Recent Labs    07/07/24 0310 07/08/24 0425  WBC 6.9 6.5  HGB 12.7 10.9*  HCT 37.0 32.3*  PLT 143* 140*    Blood type: --/--/B POS (07/16 9692)  Rubella: Immune (12/26 0000)   I&O: I/O last 3 completed shifts: In: 0  Out: 486 [Blood:486]          No intake/output data recorded.  Gen: AAO x 3, NAD Abdomen: soft, non-tender, non-distended Fundus: firm, non-tender, U-below.  Perineum: Appropriate healing for PPD1. Minimal swelling, slightly tender to touch.  Lochia: Minimal.  Extremities: No edema at this time, no calf pain or  tenderness.   A/P:  PPD # 1 38 y.o., [id number]   Principal Problem:   Postpartum care following vaginal delivery 7/16 Active Problems:   Normal labor and delivery   SVD (spontaneous vaginal delivery)   Vaginal laceration and right labial laceration with repair   Gestational diabetes GDMA1: Reviewed pptm signs of concern/ or glucose instability. to continue to focus on well balanced diet for breastfeeding, otherwise appropriate to resume normal diet as tolerated. 2-hr Glucose testing at 6-week visit.  Insignificant anemia of pregnancy, asymptomatic at this time. To continue PO iron  at home.   Doing well overall - stable status.encouraged hydration, ambulation and stool softeners as need pptm for routine BM.  Education: Postpartum warning signs given, bleeding, pain, PDD and infection. To call with concern. To follow-up at 6-week visit for routine pptm care and GTT or as needed/indicated. Pt and spouse verbalize understanding.   Routine post partum orders.Desiring discharge as soon as possible today, Dr. Linnell aware of pt status and POC.    Sherrilee KANDICE Both, MSN, CNM 07/08/2024, 9:47 AM

## 2024-07-27 ENCOUNTER — Telehealth (HOSPITAL_COMMUNITY): Payer: Self-pay | Admitting: *Deleted

## 2024-07-27 NOTE — Telephone Encounter (Signed)
 07/27/2024  Name: Alyssa Montes MRN: [id number] DOB: 07-31-1986  Reason for Call:  Transition of Care Hospital Discharge Call  Contact Status: Patient Contact Status: Message  Language assistant needed:          Follow-Up Questions:    Van Postnatal Depression Scale:  In the Past 7 Days:    PHQ2-9 Depression Scale:     Discharge Follow-up:    Post-discharge interventions: NA  Mliss Sieve, RN 07/27/2024 11:42

## 2024-09-27 ENCOUNTER — Telehealth: Payer: Self-pay

## 2024-09-27 NOTE — Telephone Encounter (Signed)
 I left a voicemail for patient asking her to please complete the new patient information via MyChart prior to check-in for her appointment tomorrow with Dr. Verneita Kettering.  I let patient know that she may plan to arrive for her appointment earlier to complete the new patient packet if she does not see the information in MyChart, or she may stop by today to pick up a packet.  Patient is re-establishing with Dr. Kettering.  Patient last seen in 2014.

## 2024-09-28 ENCOUNTER — Ambulatory Visit: Payer: Self-pay | Admitting: Internal Medicine

## 2024-09-28 ENCOUNTER — Encounter: Payer: Self-pay | Admitting: Internal Medicine

## 2024-09-28 VITALS — BP 106/84 | HR 72 | Ht 65.0 in | Wt 153.2 lb

## 2024-09-28 DIAGNOSIS — R5383 Other fatigue: Secondary | ICD-10-CM | POA: Diagnosis not present

## 2024-09-28 DIAGNOSIS — S3141XS Laceration without foreign body of vagina and vulva, sequela: Secondary | ICD-10-CM | POA: Diagnosis not present

## 2024-09-28 DIAGNOSIS — E785 Hyperlipidemia, unspecified: Secondary | ICD-10-CM

## 2024-09-28 DIAGNOSIS — K2100 Gastro-esophageal reflux disease with esophagitis, without bleeding: Secondary | ICD-10-CM

## 2024-09-28 DIAGNOSIS — D62 Acute posthemorrhagic anemia: Secondary | ICD-10-CM | POA: Diagnosis not present

## 2024-09-28 DIAGNOSIS — L309 Dermatitis, unspecified: Secondary | ICD-10-CM

## 2024-09-28 DIAGNOSIS — Z1159 Encounter for screening for other viral diseases: Secondary | ICD-10-CM | POA: Diagnosis not present

## 2024-09-28 DIAGNOSIS — R7301 Impaired fasting glucose: Secondary | ICD-10-CM | POA: Diagnosis not present

## 2024-09-28 DIAGNOSIS — Z8632 Personal history of gestational diabetes: Secondary | ICD-10-CM

## 2024-09-28 DIAGNOSIS — Z862 Personal history of diseases of the blood and blood-forming organs and certain disorders involving the immune mechanism: Secondary | ICD-10-CM

## 2024-09-28 DIAGNOSIS — E162 Hypoglycemia, unspecified: Secondary | ICD-10-CM | POA: Insufficient documentation

## 2024-09-28 LAB — CBC WITH DIFFERENTIAL/PLATELET
Basophils Absolute: 0 K/uL (ref 0.0–0.1)
Basophils Relative: 0.5 % (ref 0.0–3.0)
Eosinophils Absolute: 0.1 K/uL (ref 0.0–0.7)
Eosinophils Relative: 1.8 % (ref 0.0–5.0)
HCT: 40.9 % (ref 36.0–46.0)
Hemoglobin: 14 g/dL (ref 12.0–15.0)
Lymphocytes Relative: 42.2 % (ref 12.0–46.0)
Lymphs Abs: 1.7 K/uL (ref 0.7–4.0)
MCHC: 34.2 g/dL (ref 30.0–36.0)
MCV: 90.2 fl (ref 78.0–100.0)
Monocytes Absolute: 0.3 K/uL (ref 0.1–1.0)
Monocytes Relative: 7 % (ref 3.0–12.0)
Neutro Abs: 2 K/uL (ref 1.4–7.7)
Neutrophils Relative %: 48.5 % (ref 43.0–77.0)
Platelets: 235 K/uL (ref 150.0–400.0)
RBC: 4.54 Mil/uL (ref 3.87–5.11)
RDW: 13.8 % (ref 11.5–15.5)
WBC: 4.1 K/uL (ref 4.0–10.5)

## 2024-09-28 LAB — IBC + FERRITIN
Ferritin: 22.1 ng/mL (ref 10.0–291.0)
Iron: 157 ug/dL — ABNORMAL HIGH (ref 42–145)
Saturation Ratios: 40.2 % (ref 20.0–50.0)
TIBC: 390.6 ug/dL (ref 250.0–450.0)
Transferrin: 279 mg/dL (ref 212.0–360.0)

## 2024-09-28 LAB — COMPREHENSIVE METABOLIC PANEL WITH GFR
ALT: 12 U/L (ref 0–35)
AST: 14 U/L (ref 0–37)
Albumin: 4.7 g/dL (ref 3.5–5.2)
Alkaline Phosphatase: 50 U/L (ref 39–117)
BUN: 18 mg/dL (ref 6–23)
CO2: 29 meq/L (ref 19–32)
Calcium: 9.3 mg/dL (ref 8.4–10.5)
Chloride: 103 meq/L (ref 96–112)
Creatinine, Ser: 0.67 mg/dL (ref 0.40–1.20)
GFR: 111.98 mL/min (ref >60.00)
Glucose, Bld: 85 mg/dL (ref 70–99)
Potassium: 4.6 meq/L (ref 3.5–5.1)
Sodium: 139 meq/L (ref 135–145)
Total Bilirubin: 0.8 mg/dL (ref 0.2–1.2)
Total Protein: 6.9 g/dL (ref 6.0–8.3)

## 2024-09-28 LAB — LIPID PANEL
Cholesterol: 213 mg/dL — ABNORMAL HIGH (ref 0–200)
HDL: 57.9 mg/dL (ref >39.00)
LDL Cholesterol: 140 mg/dL — ABNORMAL HIGH (ref 0–99)
NonHDL: 155.37
Total CHOL/HDL Ratio: 4
Triglycerides: 79 mg/dL (ref 0.0–149.0)
VLDL: 15.8 mg/dL (ref 0.0–40.0)

## 2024-09-28 LAB — LDL CHOLESTEROL, DIRECT: Direct LDL: 142 mg/dL

## 2024-09-28 LAB — TSH: TSH: 2.18 u[IU]/mL (ref 0.35–5.50)

## 2024-09-28 LAB — HEMOGLOBIN A1C: Hgb A1c MFr Bld: 5.1 % (ref 4.6–6.5)

## 2024-09-28 MED ORDER — TRIAMCINOLONE ACETONIDE 0.1 % EX CREA: 1.0000 | TOPICAL_CREAM | Freq: Two times a day (BID) | CUTANEOUS | 1 refills | Status: AC

## 2024-09-28 NOTE — Progress Notes (Signed)
 Subjective:  Patient ID: Alyssa Montes, female    DOB: 06-Aug-1986  Age: 38 y.o. MRN: [id number]  CC: The primary encounter diagnosis was Dyslipidemia. Diagnoses of Impaired fasting glucose, Other fatigue, Non-obstetric vaginal laceration without foreign body, unspecified whether perineal laceration present, sequela, Acute blood loss as cause of postoperative anemia, History of gestational diabetes mellitus, not currently pregnant, Encounter for hepatitis C screening test for low risk patient, Hypoglycemia, History of anemia, Eczema of right hand, and Gastroesophageal reflux disease with esophagitis without hemorrhage were also pertinent to this visit.  HPI Alyssa Montes presents for establishment of care   Alyssa Montes is a 38 yr old female with a history of gestational diabetes and PIH who presents for follow up after delivering her 2nd child in July.  Her spontaneous vaginal delivery was complicated by a  labial  tear .    Anemia: she has resumed a menstrual cycle as of Sept  16, which was 2 months post delivery,   (menses  x 5 days ),  and her second menses occurred on  Oct 1 and lasted  4-5 days .  She is not taking iron  .  Not craving ice. Alyssa Montes iron  supplements during last half of pregnancy   Gestational diabetes:  she has had some fasting hypoglycemic symptoms past partum after several days of indulging in sugar , followed by a wean  and now following a healthy diet.  None in 3  or 4 weeks .    Breast feeding but plans to switch to formula ; returning to work on Thursday   GERD was treated with famotidine  bid  during pregnancy,  has improved . Using tums.  Symptoms occurring late at night   ECZEMA OF HANDS HAS  RECURRED  VARICOSE VEINS WORSE DURING PREGNANCY     History Alyssa Montes has a past medical history of Dysmenorrhea, GERD (gastroesophageal reflux disease) (2021), Gestational diabetes (07/07/2024), Normal labor and delivery (05/09/2022), PCOS (polycystic ovarian syndrome),  Postpartum care following vaginal delivery 7/16 (05/10/2022), SVD (spontaneous vaginal delivery) (05/10/2022), and Vaginal laceration and right labial laceration with repair (07/07/2024).   She has a past surgical history that includes Elbow surgery (2002); Foot surgery (Right, 2008); and Fracture surgery (2008).   Her family history includes Alcohol abuse in her sister; Heart disease in her maternal grandfather and paternal grandfather; Hyperlipidemia in her father; Stroke in her maternal grandfather and paternal grandfather; Varicose Veins in her father.She reports that she has never smoked. She has never used smokeless tobacco. She reports current alcohol use of about 4.0 standard drinks of alcohol per week. She reports that she does not use drugs.  Outpatient Medications Prior to Visit  Medication Sig Dispense Refill   Cholecalciferol (VITAMIN D-3) 25 MCG (1000 UT) CAPS Take 1,000 Units by mouth daily.     Multiple Vitamin (MULTIVITAMIN) tablet Take 1 tablet by mouth daily.     acetaminophen  (TYLENOL ) 325 MG tablet Take 2 tablets (650 mg total) by mouth every 4 (four) hours as needed (for pain scale < 4). (Patient not taking: Reported on 01/15/2024)     calcium  carbonate (TUMS EX) 750 MG chewable tablet Chew 3,000 mg by mouth daily as needed for heartburn.     famotidine  (PEPCID  AC MAXIMUM STRENGTH) 20 MG tablet Take 20 mg by mouth daily as needed for heartburn.     ferrous sulfate 325 (65 FE) MG EC tablet Take 325 mg by mouth 3 (three) times daily with meals.  ibuprofen  (ADVIL ) 600 MG tablet Take 1 tablet (600 mg total) by mouth every 6 (six) hours. 30 tablet 0   Magnesium  Oxide 400 (240 Mg) MG TABS Take 1 tablet (400 mg total) by mouth daily. For prevention of constipation. (Patient not taking: Reported on 05/09/2022) 30 tablet    Prenatal Vit-Fe Fumarate-FA (PRENATAL PO) Take 1 tablet by mouth daily.     senna-docusate (SENOKOT-S) 8.6-50 MG tablet Take 2 tablets by mouth daily. 30 tablet 0    No facility-administered medications prior to visit.    Review of Systems:  Patient denies headache, fevers, malaise, unintentional weight loss, skin rash, eye pain, sinus congestion and sinus pain, sore throat, dysphagia,  hemoptysis , cough, dyspnea, wheezing, chest pain, palpitations, orthopnea, edema, abdominal pain, nausea, melena, diarrhea, constipation, flank pain, dysuria, hematuria, urinary  Frequency, nocturia, numbness, tingling, seizures,  Focal weakness, Loss of consciousness,  Tremor, insomnia, depression, anxiety, and suicidal ideation.     Objective:  BP 106/84   Pulse 72   Ht 5' 5 (1.651 m)   Wt 153 lb 3.2 oz (69.5 kg)   SpO2 99%   Breastfeeding Yes   BMI 25.49 kg/m   Physical Exam Vitals reviewed.  Constitutional:      General: She is not in acute distress.    Appearance: Normal appearance. She is normal weight. She is not ill-appearing, toxic-appearing or diaphoretic.  HENT:     Head: Normocephalic.  Eyes:     General: No scleral icterus.       Right eye: No discharge.        Left eye: No discharge.     Conjunctiva/sclera: Conjunctivae normal.  Cardiovascular:     Rate and Rhythm: Normal rate and regular rhythm.     Heart sounds: Normal heart sounds.  Pulmonary:     Effort: Pulmonary effort is normal. No respiratory distress.     Breath sounds: Normal breath sounds.  Musculoskeletal:        General: Normal range of motion.  Skin:    General: Skin is warm and dry.     Findings: Rash present. Rash is scaling.     Comments: SCALING RASH ON PALMS   Neurological:     General: No focal deficit present.     Mental Status: She is alert and oriented to person, place, and time. Mental status is at baseline.  Psychiatric:        Mood and Affect: Mood normal.        Behavior: Behavior normal.        Thought Content: Thought content normal.        Judgment: Judgment normal.     Assessment & Plan:  Dyslipidemia -     Lipid panel -     LDL cholesterol,  direct  Impaired fasting glucose  Other fatigue -     TSH  Non-obstetric vaginal laceration without foreign body, unspecified whether perineal laceration present, sequela  Acute blood loss as cause of postoperative anemia -     CBC with Differential/Platelet -     IBC + Ferritin  History of gestational diabetes mellitus, not currently pregnant -     Comprehensive metabolic panel with GFR -     Hemoglobin A1c  Encounter for hepatitis C screening test for low risk patient -     Hepatitis C antibody  Hypoglycemia Assessment & Plan: A1c is normal  she has a HISTORY Of gestational diabetes  Lab Results  Component Value Date   HGBA1C  5.1 09/28/2024      History of anemia Assessment & Plan: Normal iron  and hemoglobin    Lab Results  Component Value Date   WBC 4.1 09/28/2024   HGB 14.0 09/28/2024   HCT 40.9 09/28/2024   MCV 90.2 09/28/2024   PLT 235.0 09/28/2024   Lab Results  Component Value Date   IRON  157 (H) 09/28/2024   TIBC 390.6 09/28/2024   FERRITIN 22.1 09/28/2024     Eczema of right hand Assessment & Plan: Triamcinolone cream prescribed    Gastroesophageal reflux disease with esophagitis without hemorrhage Assessment & Plan: GERD: by history.  Patient is advised to continue famotidine  daily  and avoiding foods and behaviors that trigger her reflux symptoms.  She denies any recent episodes of chest pain  . behavioral changes discussed  And advised to not eat within within 2 hours of lying down,,  Avoid overeating,  And avoid chocolate and mint at the end of meal    Other orders -     Triamcinolone Acetonide; Apply 1 Application topically 2 (two) times daily.  Dispense: 3045 g; Refill: 1     Follow-up: Return in about 6 months (around 03/29/2025).   I provided 30 minutes during this encounter reviewing patient's last visit with previous provider,  most recent imaging studies and labs.  Provided counseling on the above mentioned problems and  coordination of care.   Alyssa LITTIE Kettering, MD

## 2024-09-28 NOTE — Patient Instructions (Signed)
 WELCOME BACK!  GERD IS AGGRAVATED BY:  CERTAIN FOODS BIG MEALS LYING DOWN WITHIN 2 HOURS OF EATING   YOU CAN USE FAMOTIDINE   AND TUMS.  IF SYMPTOMS PERSIST ,CALL FOR MORE COMPREHENSIVE THERAPY WITH OMEPRAZOLE   VARICOSE VEINS DO NOT INCREASE YOUR RISK FOR DEEP VEIN THROMBOSIS  EXERCISE AND USE OF COMPRESSION KNEE HIGHS OR STOCKINGS  ARE THE MAINSTAY OF THERAPY (AND WOULD BE RECOMMENDED BY VASCULAR SURGERY  IF YOU SAW THEM NOW)   I HAVE REFILLED THE ECZEMA CREAM

## 2024-09-28 NOTE — Assessment & Plan Note (Signed)
 A1c is normal  she has a HISTORY Of gestational diabetes  Lab Results  Component Value Date   HGBA1C 5.1 09/28/2024

## 2024-09-28 NOTE — Assessment & Plan Note (Signed)
 GERD: by history.  Patient is advised to continue famotidine  daily  and avoiding foods and behaviors that trigger her reflux symptoms.  She denies any recent episodes of chest pain  . behavioral changes discussed  And advised to not eat within within 2 hours of lying down,,  Avoid overeating,  And avoid chocolate and mint at the end of meal

## 2024-09-28 NOTE — Assessment & Plan Note (Addendum)
Triamcinolone cream prescribed.

## 2024-09-28 NOTE — Assessment & Plan Note (Signed)
 Normal iron  and hemoglobin    Lab Results  Component Value Date   WBC 4.1 09/28/2024   HGB 14.0 09/28/2024   HCT 40.9 09/28/2024   MCV 90.2 09/28/2024   PLT 235.0 09/28/2024   Lab Results  Component Value Date   IRON  157 (H) 09/28/2024   TIBC 390.6 09/28/2024   FERRITIN 22.1 09/28/2024

## 2024-09-29 LAB — HEPATITIS C ANTIBODY: Hepatitis C Ab: NONREACTIVE

## 2024-10-02 ENCOUNTER — Ambulatory Visit: Payer: Self-pay | Admitting: Internal Medicine
